# Patient Record
Sex: Female | Born: 2000 | Race: Black or African American | Hispanic: No | Marital: Single | Smoking: Never smoker
Health system: Southern US, Community
[De-identification: ages and names within clinical notes are randomized; demographics above are authoritative.]

## PROBLEM LIST (undated history)

## (undated) ENCOUNTER — Inpatient Hospital Stay (HOSPITAL_COMMUNITY): Payer: Self-pay

## (undated) ENCOUNTER — Ambulatory Visit (HOSPITAL_COMMUNITY): Payer: No Typology Code available for payment source

## (undated) DIAGNOSIS — F419 Anxiety disorder, unspecified: Secondary | ICD-10-CM

## (undated) DIAGNOSIS — N390 Urinary tract infection, site not specified: Secondary | ICD-10-CM

## (undated) DIAGNOSIS — D649 Anemia, unspecified: Secondary | ICD-10-CM

## (undated) HISTORY — PX: NO PAST SURGERIES: SHX2092

---
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
## 2022-04-04 ENCOUNTER — Emergency Department (HOSPITAL_COMMUNITY)
Admission: EM | Admit: 2022-04-04 | Discharge: 2022-04-04 | Disposition: A | Discharge status: Home / Self Care | Payer: Medicaid Other | Attending: Emergency Medicine | Admitting: Emergency Medicine

## 2022-04-04 ENCOUNTER — Encounter (HOSPITAL_COMMUNITY): Payer: Self-pay | Admitting: Emergency Medicine

## 2022-04-04 ENCOUNTER — Other Ambulatory Visit: Payer: Self-pay

## 2022-04-04 ENCOUNTER — Emergency Department (HOSPITAL_COMMUNITY): Payer: Medicaid Other

## 2022-04-04 DIAGNOSIS — R1030 Lower abdominal pain, unspecified: Secondary | ICD-10-CM

## 2022-04-04 DIAGNOSIS — R1031 Right lower quadrant pain: Secondary | ICD-10-CM | POA: Insufficient documentation

## 2022-04-04 DIAGNOSIS — R000 Tachycardia, unspecified: Secondary | ICD-10-CM | POA: Diagnosis not present

## 2022-04-04 LAB — CBC
HCT: 32.2 % — ABNORMAL LOW (ref 36.0–46.0)
Hemoglobin: 9.6 g/dL — ABNORMAL LOW (ref 12.0–15.0)
MCH: 20 pg — ABNORMAL LOW (ref 26.0–34.0)
MCHC: 29.8 g/dL — ABNORMAL LOW (ref 30.0–36.0)
MCV: 67.1 fL — ABNORMAL LOW (ref 80.0–100.0)
Platelets: 289 10*3/uL (ref 150–400)
RBC: 4.8 MIL/uL (ref 3.87–5.11)
RDW: 17.4 % — ABNORMAL HIGH (ref 11.5–15.5)
WBC: 6.8 10*3/uL (ref 4.0–10.5)
nRBC: 0 % (ref 0.0–0.2)

## 2022-04-04 LAB — URINALYSIS, ROUTINE W REFLEX MICROSCOPIC
Bacteria, UA: NONE SEEN
Bilirubin Urine: NEGATIVE
Glucose, UA: NEGATIVE mg/dL
Hgb urine dipstick: NEGATIVE
Ketones, ur: NEGATIVE mg/dL
Nitrite: NEGATIVE
Protein, ur: NEGATIVE mg/dL
Specific Gravity, Urine: 1.013 (ref 1.005–1.030)
pH: 8 (ref 5.0–8.0)

## 2022-04-04 LAB — COMPREHENSIVE METABOLIC PANEL
ALT: 9 U/L (ref 0–44)
AST: 18 U/L (ref 15–41)
Albumin: 4.1 g/dL (ref 3.5–5.0)
Alkaline Phosphatase: 66 U/L (ref 38–126)
Anion gap: 6 (ref 5–15)
BUN: 8 mg/dL (ref 6–20)
CO2: 23 mmol/L (ref 22–32)
Calcium: 10 mg/dL (ref 8.9–10.3)
Chloride: 107 mmol/L (ref 98–111)
Creatinine, Ser: 0.67 mg/dL (ref 0.44–1.00)
GFR, Estimated: >60 mL/min (ref >60)
Glucose, Bld: 91 mg/dL (ref 70–99)
Potassium: 4.4 mmol/L (ref 3.5–5.1)
Sodium: 136 mmol/L (ref 135–145)
Total Bilirubin: 0.4 mg/dL (ref 0.3–1.2)
Total Protein: 7.6 g/dL (ref 6.5–8.1)

## 2022-04-04 LAB — I-STAT BETA HCG BLOOD, ED (MC, WL, AP ONLY): I-stat hCG, quantitative: <5 m[IU]/mL (ref <5)

## 2022-04-04 LAB — LIPASE, BLOOD: Lipase: 33 U/L (ref 11–51)

## 2022-04-04 MED ORDER — ACETAMINOPHEN 500 MG PO TABS
1000.0000 mg | ORAL_TABLET | Freq: Once | ORAL | Status: AC
  Administered 2022-04-04: 1000 mg via ORAL
  Filled 2022-04-04: qty 2

## 2022-04-04 MED ORDER — ONDANSETRON 4 MG PO TBDP
8.0000 mg | ORAL_TABLET | Freq: Once | ORAL | Status: AC
  Administered 2022-04-04: 8 mg via ORAL
  Filled 2022-04-04: qty 2

## 2022-04-04 NOTE — ED Provider Notes (Signed)
[facility] EMERGENCY DEPARTMENT Provider Note   CSN: [id number] Arrival date & time: 04/04/22  1253     History  Chief Complaint  Patient presents with   Nausea   Abdominal Cramping    Amy Bond is a 21 y.o. female.   Abdominal Cramping     Patient with no documented comorbidities presents today due to right lower abdominal pain.  Started this morning after sexual intercourse with her partner.  States she has been trying to get pregnant, is concerned she could be pregnant today.  She endorses some swelling in her breast, the right lower quadrant abdominal pain is intermittent, it started off feeling very severe with a cramping sensation.  It went away, came back again.  Denies any periumbilical pain, vomiting, vaginal discharge, vaginal bleeding, previous abdominal surgeries, fevers.  Has had no medicine prior to arrival.  Home Medications Prior to Admission medications   Medication Sig Start Date End Date Taking? Authorizing Provider  ibuprofen (ADVIL,MOTRIN) 100 MG/5ML suspension Take 17 mLs (340 mg total) by mouth once. 07/20/13   Junius Creamer, NP      Allergies    Patient has no known allergies.    Review of Systems   Review of Systems  Physical Exam Updated Vital Signs BP 133/88 (BP Location: Right Arm)   Pulse (!) 114   Temp 98.3 F (36.8 C) (Oral)   Resp 18   SpO2 100%  Physical Exam Vitals and nursing note reviewed. Exam conducted with a chaperone present.  Constitutional:      Appearance: Normal appearance.     Comments: Comfortable appearing  HENT:     Head: Normocephalic and atraumatic.  Eyes:     General: No scleral icterus.       Right eye: No discharge.        Left eye: No discharge.     Extraocular Movements: Extraocular movements intact.     Pupils: Pupils are equal, round, and reactive to light.  Cardiovascular:     Rate and Rhythm: Regular rhythm. Tachycardia present.     Pulses: Normal pulses.     Heart sounds:  Normal heart sounds. No murmur heard.    No friction rub. No gallop.  Pulmonary:     Effort: Pulmonary effort is normal. No respiratory distress.     Breath sounds: Normal breath sounds.  Abdominal:     General: Abdomen is flat. Bowel sounds are normal. There is no distension.     Palpations: Abdomen is soft.     Tenderness: There is abdominal tenderness in the right lower quadrant. There is no right CVA tenderness, left CVA tenderness, guarding or rebound.    Skin:    General: Skin is warm and dry.     Coloration: Skin is not jaundiced.  Neurological:     Mental Status: She is alert. Mental status is at baseline.     Coordination: Coordination normal.     ED Results / Procedures / Treatments   Labs (all labs ordered are listed, but only abnormal results are displayed) Labs Reviewed  CBC - Abnormal; Notable for the following components:      Result Value   Hemoglobin 9.6 (*)    HCT 32.2 (*)    MCV 67.1 (*)    MCH 20.0 (*)    MCHC 29.8 (*)    RDW 17.4 (*)    All other components within normal limits  URINALYSIS, ROUTINE W REFLEX MICROSCOPIC - Abnormal; Notable for the  following components:   Color, Urine STRAW (*)    Leukocytes,Ua TRACE (*)    All other components within normal limits  LIPASE, BLOOD  COMPREHENSIVE METABOLIC PANEL  I-STAT BETA HCG BLOOD, ED ([facility], [facility], [facility] ONLY)    EKG None  Radiology No results found.  Procedures Procedures    Medications Ordered in ED Medications - No data to display  ED Course/ Medical Decision Making/ A&P                           Medical Decision Making Amount and/or Complexity of Data Reviewed Labs: ordered. Radiology: ordered.  Risk OTC drugs. Prescription drug management.   Patient presents due to right lower quadrant abdominal pain.  Differential includes but not limited to appendicitis, ovarian torsion, ovarian cyst, UTI, pyelonephritis, ruptured ectopic pregnancy, PID.  On exam patient has right lower  quadrant tenderness, there is no rebound tenderness or guarding.  No CVA tenderness, nontoxic-appearing although mildly tachycardic she is afebrile.  No SIRS criteria met. -BP 133/88 (BP Location: Right Arm)   Pulse (!) 114   Temp 98.3 F (36.8 C) (Oral)   Resp 18   SpO2 100%   I ordered, viewed and interpreted laboratory work-up. -CBC without leukocytosis.  Stable anemia with a hemoglobin of 9.6. -CMP without gross electrolyte derangement, AKI or transaminitis. -Patient is not pregnant, not an ectopic . -UA with very trace amount of leukocytes, no signs of UTI or pyelonephritis.    I ordered, viewed and interpreted ultrasound.  There is moderate mount of fluid which could be supportive of recent ovarian cyst rupture.  I think that is most likely given symptoms came on suddenly and then resolved and have not returned.    I considered getting CT of the abdomen to rule out appendicitis.  I think is somewhat less likely given no rebound, no leukocytosis or fever.  Patient states she has dinner plans and cannot stay later, we discussed strict return precautions         Final Clinical Impression(s) / ED Diagnoses Final diagnoses:  None    Rx / DC Orders ED Discharge Orders     None         Sherrill Raring, PA-C 04/04/22 2326    Blanchie Dessert, MD 04/05/22 1234

## 2022-04-04 NOTE — ED Notes (Signed)
Discharge instructions reviewed with patient. Patient verbalized understanding of instructions. Follow-up care and medications were reviewed. Patient ambulatory with steady gait. VSS upon discharge.  ?

## 2022-04-04 NOTE — Discharge Instructions (Addendum)
You are seen today in the emergency department for pain in the lower abdomen.  As we discussed I think the symptoms are likely due to urine cyst rupture.  The ultrasound supports that, we did not proceed with CT scan to rule for appendicitis given her pain is improved.  If the pain returns he should return back to the ED for CT scan.  Additionally if you have new concerning symptoms return to ED otherwise follow-up with your primary OB.  TRANSABDOMINAL AND TRANSVAGINAL ULTRASOUND OF PELVIS    DOPPLER ULTRASOUND OF OVARIES    TECHNIQUE:  Both transabdominal and transvaginal ultrasound examinations of the  pelvis were performed. Transabdominal technique was performed for  global imaging of the pelvis including uterus, ovaries, adnexal  regions, and pelvic cul-de-sac.    It was necessary to proceed with endovaginal exam following the  transabdominal exam to visualize the endometrium and ovaries. Color  and duplex Doppler ultrasound was utilized to evaluate blood flow to  the ovaries.    COMPARISON:  None Available.    FINDINGS:  Uterus    Measurements: 6.6 x 3.6 x 4.5 cm = volume: 56.3 19 mL. No fibroids  or other mass visualized.    Endometrium    Thickness: 7.8 mm.  No focal abnormality visualized.    Right ovary    Measurements: 3 x 3.2 x 3.4 cm = volume: 17.5 mL. Normal  appearance/no adnexal mass.    Left ovary    Measurements: 2.5 x 3 x 3 cm = volume: 12.1 mL. Normal appearance/no  adnexal mass.    Pulsed Doppler evaluation of both ovaries demonstrates normal  low-resistance arterial and venous waveforms.    Other findings    There is small to moderate amount of free fluid in pelvis.    IMPRESSION:  No significant sonographic abnormalities are seen in pelvis. There  is no evidence of ovarian torsion.    Small to moderate amount of free fluid in pelvis may suggest recent  rupture of ovarian cyst or follicle.

## 2022-04-04 NOTE — ED Triage Notes (Signed)
Patient endorses for the the last few weeks patient states she's been nauseated, exhausted, complaining of RLQ abdominal cramping, and urinary frequency. Patient unsure if she could be pregnant or not. Denies fevers/ chills. Aox4.

## 2022-04-17 ENCOUNTER — Encounter (HOSPITAL_COMMUNITY): Payer: Self-pay | Admitting: Emergency Medicine

## 2022-04-17 ENCOUNTER — Emergency Department (HOSPITAL_COMMUNITY)
Admission: EM | Admit: 2022-04-17 | Discharge: 2022-04-18 | Discharge status: Against Medical Advice | Payer: Medicaid Other | Attending: Emergency Medicine | Admitting: Emergency Medicine

## 2022-04-17 ENCOUNTER — Other Ambulatory Visit: Payer: Self-pay

## 2022-04-17 DIAGNOSIS — Z5321 Procedure and treatment not carried out due to patient leaving prior to being seen by health care provider: Secondary | ICD-10-CM | POA: Insufficient documentation

## 2022-04-17 DIAGNOSIS — R21 Rash and other nonspecific skin eruption: Secondary | ICD-10-CM | POA: Insufficient documentation

## 2022-04-17 NOTE — ED Provider Triage Note (Signed)
  Emergency Medicine Provider Triage Evaluation Note  MRN:  [id number]  Arrival date & time: 04/18/22    Medically screening exam initiated at 1:10 AM.   CC:   Rash  HPI:  Amy Bond is a 21 y.o. year-old female presents to the ED with chief complaint of rash on extremities.  Recent new pet.  States that it itches.  Denies seeing any insects.  History provided by patient. ROS:  -As included in HPI PE:  There were no vitals filed for this visit. Non-toxic appearing No respiratory distress A few macular lesions MDM:  Based on signs and symptoms, could be ringworm or scabies or flea bites.   Patient was informed that the remainder of the evaluation will be completed by another provider, this initial triage assessment does not replace that evaluation, and the importance of remaining in the ED until their evaluation is complete.    Montine Circle, PA-C 04/18/22 0110

## 2022-04-17 NOTE — ED Triage Notes (Signed)
Pt here with rash to her chest, c/o itching.

## 2022-04-18 NOTE — ED Notes (Signed)
Pt called for vital recheck x4 with no response. Pt removed from the floor.

## 2022-04-29 ENCOUNTER — Encounter (HOSPITAL_COMMUNITY): Payer: Self-pay

## 2022-04-29 ENCOUNTER — Emergency Department (HOSPITAL_COMMUNITY)
Admission: EM | Admit: 2022-04-29 | Discharge: 2022-04-29 | Discharge status: Against Medical Advice | Payer: Medicaid Other | Attending: Emergency Medicine | Admitting: Emergency Medicine

## 2022-04-29 ENCOUNTER — Other Ambulatory Visit: Payer: Self-pay

## 2022-04-29 DIAGNOSIS — Y92410 Unspecified street and highway as the place of occurrence of the external cause: Secondary | ICD-10-CM | POA: Diagnosis not present

## 2022-04-29 DIAGNOSIS — M545 Low back pain, unspecified: Secondary | ICD-10-CM | POA: Insufficient documentation

## 2022-04-29 DIAGNOSIS — M549 Dorsalgia, unspecified: Secondary | ICD-10-CM | POA: Diagnosis present

## 2022-04-29 LAB — BASIC METABOLIC PANEL
Anion gap: 9 (ref 5–15)
BUN: 9 mg/dL (ref 6–20)
CO2: 24 mmol/L (ref 22–32)
Calcium: 9.9 mg/dL (ref 8.9–10.3)
Chloride: 108 mmol/L (ref 98–111)
Creatinine, Ser: 0.79 mg/dL (ref 0.44–1.00)
GFR, Estimated: >60 mL/min (ref >60)
Glucose, Bld: 105 mg/dL — ABNORMAL HIGH (ref 70–99)
Potassium: 4.2 mmol/L (ref 3.5–5.1)
Sodium: 141 mmol/L (ref 135–145)

## 2022-04-29 LAB — I-STAT BETA HCG BLOOD, ED (MC, WL, AP ONLY): I-stat hCG, quantitative: 53.3 m[IU]/mL — ABNORMAL HIGH (ref <5)

## 2022-04-29 LAB — CBC
HCT: 31.2 % — ABNORMAL LOW (ref 36.0–46.0)
Hemoglobin: 9.2 g/dL — ABNORMAL LOW (ref 12.0–15.0)
MCH: 19.9 pg — ABNORMAL LOW (ref 26.0–34.0)
MCHC: 29.5 g/dL — ABNORMAL LOW (ref 30.0–36.0)
MCV: 67.4 fL — ABNORMAL LOW (ref 80.0–100.0)
Platelets: 326 10*3/uL (ref 150–400)
RBC: 4.63 MIL/uL (ref 3.87–5.11)
RDW: 17.9 % — ABNORMAL HIGH (ref 11.5–15.5)
WBC: 4.8 10*3/uL (ref 4.0–10.5)
nRBC: 0 % (ref 0.0–0.2)
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
## 2022-04-29 NOTE — ED Notes (Signed)
Patient refusing VS. Walked out stating "I'm losing my mind. I cant be here any more." Refusing to signed [facility] paperwork.
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
## 2022-04-30 ENCOUNTER — Other Ambulatory Visit: Payer: Self-pay

## 2022-04-30 ENCOUNTER — Encounter (HOSPITAL_COMMUNITY): Payer: Self-pay | Admitting: *Deleted

## 2022-04-30 ENCOUNTER — Inpatient Hospital Stay (HOSPITAL_COMMUNITY)
Admission: AD | Admit: 2022-04-30 | Discharge: 2022-04-30 | Disposition: A | Discharge status: Home / Self Care | Payer: Medicaid Other | Attending: Obstetrics and Gynecology | Admitting: Obstetrics and Gynecology

## 2022-04-30 DIAGNOSIS — Z3201 Encounter for pregnancy test, result positive: Secondary | ICD-10-CM | POA: Insufficient documentation

## 2022-04-30 DIAGNOSIS — Z3A01 Less than 8 weeks gestation of pregnancy: Secondary | ICD-10-CM | POA: Diagnosis not present

## 2022-04-30 DIAGNOSIS — O161 Unspecified maternal hypertension, first trimester: Secondary | ICD-10-CM

## 2022-04-30 DIAGNOSIS — R030 Elevated blood-pressure reading, without diagnosis of hypertension: Secondary | ICD-10-CM | POA: Insufficient documentation

## 2022-04-30 DIAGNOSIS — O26891 Other specified pregnancy related conditions, first trimester: Secondary | ICD-10-CM | POA: Insufficient documentation

## 2022-04-30 NOTE — Discharge Instructions (Signed)
[facility] Ob/Gyn Providers    [facility] at [facility]    Phone: [phone]  [facility] at [facility]   Phone: [phone]  [facility] at [place]  Phone: [phone]  [facility] at [place]  Phone: [phone]  [facility] at [place]  Phone: [phone]  [facility] at [facility]   Phone: [phone]  [facility] at [facility]                     Phone: [phone]                    Safe Medications in Pregnancy    Acne: Benzoyl Peroxide Salicylic Acid  Backache/Headache: Tylenol: 2 regular strength every 4 hours OR              2 Extra strength every 6 hours  Colds/Coughs/Allergies: Benadryl (alcohol free) 25 mg every 6 hours as needed Breath right strips Claritin Cepacol throat lozenges Chloraseptic throat spray Cold-Eeze- up to three times per day Cough drops, alcohol free Flonase (by prescription only) Guaifenesin Mucinex Robitussin DM (plain only, alcohol free) Saline nasal spray/drops Sudafed (pseudoephedrine) & Actifed ** use only after [redacted] weeks gestation and if you do not have high blood pressure Tylenol Vicks Vaporub Zinc lozenges Zyrtec   Constipation: Colace Ducolax suppositories Fleet enema Glycerin suppositories Metamucil Milk of magnesia Miralax Senokot Smooth move tea  Diarrhea: Kaopectate Imodium A-D  *NO pepto Bismol  Hemorrhoids: Anusol Anusol HC Preparation H Tucks  Indigestion: Tums Maalox Mylanta Zantac  Pepcid  Insomnia: Benadryl (alcohol free) 25mg every 6 hours as needed Tylenol PM Unisom, no Gelcaps  Leg Cramps: Tums MagGel  Nausea/Vomiting:  Bonine Dramamine Emetrol Ginger extract Sea bands Meclizine  Nausea medication to take during pregnancy:  Unisom (doxylamine succinate 25 mg tablets) Take one tablet daily at bedtime.  If symptoms are not adequately controlled, the dose can be increased to a maximum recommended dose of two tablets daily (1/2 tablet in the morning, 1/2 tablet mid-afternoon and one at bedtime). Vitamin B6 100mg tablets. Take one tablet twice a day (up to 200 mg per day).  Skin Rashes: Aveeno products Benadryl cream or 25mg every 6 hours as needed Calamine Lotion 1% cortisone cream  Yeast infection: Gyne-lotrimin 7 Monistat 7   **If taking multiple medications, please check labels to avoid duplicating the same active ingredients **take medication as directed on the label ** Do not exceed 4000 mg of tylenol in 24 hours **Do not take medications that contain aspirin or ibuprofen    

## 2022-04-30 NOTE — MAU Provider Note (Signed)
S [name] is a 21 y.o. G1P0 at unknown gestation who presents to MAU today for checkup. She found out she was pregnant yesterday in the ED and wasn't sure what to do next. Denies VB or abd pain.   ROS: No pain No VB  O BP (!) 145/70 (BP Location: Right Arm)   Pulse (!) 103   Temp 98.1 F (36.7 C) (Oral)   Resp 18   Ht 5\' 5"  (1.651 m)   Wt 62.5 kg   LMP 04/16/2022 (Approximate)   SpO2 100%   BMI 22.91 kg/m  Physical Exam Vitals and nursing note reviewed.  Constitutional:      General: She is not in acute distress.    Appearance: Normal appearance.  HENT:     Head: Normocephalic and atraumatic.  Cardiovascular:     Rate and Rhythm: Normal rate.  Pulmonary:     Effort: Pulmonary effort is normal. No respiratory distress.  Musculoskeletal:        General: Normal range of motion.  Neurological:     General: No focal deficit present.     Mental Status: She is alert and oriented to person, place, and time.  Psychiatric:        Mood and Affect: Mood normal.        Behavior: Behavior normal.    Results for orders placed or performed during the hospital encounter of 04/29/22 (from the past 48 hour(s))  CBC     Status: Abnormal   Collection Time: 04/29/22  7:55 PM  Result Value Ref Range   WBC 4.8 4.0 - 10.5 K/uL   RBC 4.63 3.87 - 5.11 MIL/uL   Hemoglobin 9.2 (L) 12.0 - 15.0 g/dL   HCT 13/01/23 (L) 13.2 - 44.0 %   MCV 67.4 (L) 80.0 - 100.0 fL   MCH 19.9 (L) 26.0 - 34.0 pg   MCHC 29.5 (L) 30.0 - 36.0 g/dL   RDW 10.2 (H) 72.5 - 36.6 %   Platelets 326 150 - 400 K/uL    Comment: REPEATED TO VERIFY   nRBC 0.0 0.0 - 0.2 %    Comment: Performed at [facility], [address], [place], [place]  Basic metabolic panel     Status: Abnormal   Collection Time: 04/29/22  7:55 PM  Result Value Ref Range   Sodium 141 135 - 145 mmol/L   Potassium 4.2 3.5 - 5.1 mmol/L   Chloride 108 98 - 111 mmol/L   CO2 24 22 - 32 mmol/L   Glucose, Bld 105 (H) 70 - 99 mg/dL     Comment: Glucose reference range applies only to samples taken after fasting for at least 8 hours.   BUN 9 6 - 20 mg/dL   Creatinine, Ser 13/01/23 0.44 - 1.00 mg/dL   Calcium 9.9 8.9 - 5.95 mg/dL   GFR, Estimated 63.8 >75 mL/min    Comment: (NOTE) Calculated using the CKD-EPI Creatinine Equation (2021)    Anion gap 9 5 - 15    Comment: Performed at [facility], [address], [place], [place]  I-Stat Beta hCG blood, ED ([facility], [facility], [facility] only)     Status: Abnormal   Collection Time: 04/29/22  8:27 PM  Result Value Ref Range   I-stat hCG, quantitative 53.3 (H) <5 mIU/mL   Comment 3            Comment:   GEST. AGE      CONC.  (  mIU/mL)   <=1 WEEK        5 - 50     2 WEEKS       50 - 500     3 WEEKS       100 - 10,000     4 WEEKS     1,000 - 30,000        FEMALE AND NON-PREGNANT FEMALE:     LESS THAN 5 mIU/mL     MDM: No complaints warranting evaluation today. Pt provided with list of OB providers, safe meds sheet, and instructed to start prenatal vitamins. BP elevated, denies hx of CHTN, will monitor. Stable for discharge.  A 1. Positive pregnancy test   2. Elevated blood pressure affecting pregnancy in first trimester, antepartum    P Discharge from MAU in stable condition Warning signs for worsening condition that would warrant emergency follow-up discussed Patient may return to MAU as needed for pregnancy related complaints   Allergies as of 04/30/2022   No Known Allergies      Medication List     STOP taking these medications    ibuprofen 100 MG/5ML suspension Commonly known as: ADVIL        Amy Bond, CNM 04/30/2022 1:25 PM

## 2022-04-30 NOTE — MAU Note (Signed)
Amy Bond is a 21 y.o. at Unknown here in MAU reporting: she found out she was pregnant yesterday and is here for a check up.  Denies VB or pain LMP: 04/16/22 Onset of complaint: N/A Pain score: 0 Vitals:   04/30/22 1304  BP: (!) 145/70  Pulse: (!) 103  Resp: 18  Temp: 98.1 F (36.7 C)  SpO2: 100%     FHT:N/A Lab orders placed from triage:   None

## 2022-05-05 ENCOUNTER — Encounter (HOSPITAL_COMMUNITY): Payer: Self-pay | Admitting: Emergency Medicine

## 2022-05-05 ENCOUNTER — Emergency Department (HOSPITAL_COMMUNITY)
Admission: EM | Admit: 2022-05-05 | Discharge: 2022-05-06 | Discharge status: Against Medical Advice | Payer: Medicaid Other | Attending: Emergency Medicine | Admitting: Emergency Medicine

## 2022-05-05 ENCOUNTER — Other Ambulatory Visit: Payer: Self-pay

## 2022-05-05 DIAGNOSIS — Z5321 Procedure and treatment not carried out due to patient leaving prior to being seen by health care provider: Secondary | ICD-10-CM | POA: Diagnosis not present

## 2022-05-05 DIAGNOSIS — M545 Low back pain, unspecified: Secondary | ICD-10-CM | POA: Diagnosis not present
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
## 2022-05-06 LAB — PREGNANCY, URINE: Preg Test, Ur: NEGATIVE

## 2022-05-06 LAB — URINALYSIS, ROUTINE W REFLEX MICROSCOPIC
Bilirubin Urine: NEGATIVE
Glucose, UA: NEGATIVE mg/dL
Hgb urine dipstick: NEGATIVE
Ketones, ur: 20 mg/dL — AB
Nitrite: NEGATIVE
Protein, ur: 100 mg/dL — AB
Specific Gravity, Urine: 1.029 (ref 1.005–1.030)
pH: 5 (ref 5.0–8.0)

## 2022-05-06 LAB — I-STAT BETA HCG BLOOD, ED (MC, WL, AP ONLY): I-stat hCG, quantitative: 34.6 m[IU]/mL — ABNORMAL HIGH (ref <5)

## 2022-05-06 NOTE — ED Notes (Signed)
Pt called to be seen in triage, no answer

## 2022-05-07 ENCOUNTER — Inpatient Hospital Stay (HOSPITAL_COMMUNITY)
Admission: AD | Admit: 2022-05-07 | Discharge: 2022-05-07 | Disposition: A | Discharge status: Home / Self Care | Payer: Medicaid Other | Attending: Family Medicine | Admitting: Family Medicine

## 2022-05-07 DIAGNOSIS — O039 Complete or unspecified spontaneous abortion without complication: Secondary | ICD-10-CM | POA: Diagnosis not present

## 2022-05-07 DIAGNOSIS — Z3A01 Less than 8 weeks gestation of pregnancy: Secondary | ICD-10-CM

## 2022-05-07 LAB — HCG, QUANTITATIVE, PREGNANCY: hCG, Beta Chain, Quant, S: <1 m[IU]/mL (ref <5)

## 2022-05-07 NOTE — MAU Provider Note (Addendum)
History   Chief Complaint:  concerned about pregnancy   Amy Bond is  21 y.o. G1P0 Patient's last menstrual period was 04/16/2022 (approximate).. Patient is here for follow up of quantitative HCG and ongoing surveillance of pregnancy status. She is [redacted]w[redacted]d weeks gestation  by LMP.     Since her last visit, the patient is without new complaint. The patient reports bleeding as  none now.  She denies any pain.   General ROS:  negative   Her previous Quantitative HCG values are:   Latest Reference Range & Units 04/29/22 20:27 05/06/22 00:24  I-stat hCG, quantitative <5 mIU/mL 53.3 (H) 34.6 (H)  (H): Data is abnormally high   Physical Exam   Blood pressure 129/80, pulse 74, temperature 98.2 F (36.8 C), resp. rate 16, height 5\' 5"  (1.651 m), weight 62.1 kg, last menstrual period 04/16/2022, SpO2 99 %.   Physical Exam Vitals and nursing note reviewed.  Constitutional:      General: She is not in acute distress.    Appearance: She is well-developed.  HENT:     Head: Normocephalic.  Eyes:     Pupils: Pupils are equal, round, and reactive to light.  Cardiovascular:     Rate and Rhythm: Normal rate and regular rhythm.  Pulmonary:     Effort: Pulmonary effort is normal. No respiratory distress.     Breath sounds: Normal breath sounds.  Abdominal:     Palpations: Abdomen is soft.     Tenderness: There is no abdominal tenderness.  Musculoskeletal:        General: Normal range of motion.     Cervical back: Normal range of motion.  Skin:    General: Skin is warm and dry.  Neurological:     Mental Status: She is alert and oriented to person, place, and time.  Psychiatric:        Behavior: Behavior normal.        Thought Content: Thought content normal.        Judgment: Judgment normal.     Labs: No results found for this or any previous visit (from the past 24 hour(s)).    HCG repeated.   Care turned over to [facility] Erryn Dickison CNM at 2200.   [facility],  CNM 05/07/22 Results for orders placed or performed during the hospital encounter of 05/07/22 (from the past 24 hour(s))  hCG, quantitative, pregnancy     Status: None   Collection Time: 05/07/22 10:31 PM  Result Value Ref Range   hCG, Beta Chain, Quant, S <1 <5 mIU/mL    & Units 2 d ago 9 d ago 1 mo ago   I-stat hCG, quantitative <5 mIU/mL 34.6 High  53.3 High  <5.0  Comment 3            Reviewed results with patient and her partner Patient tearful Has never had any bleeding.  LMP 04/16/22 (3 weeks ago) Discussed will arrange followup in office  Assessment:  Pregnancy at [redacted]w[redacted]d  Complete Spontaneous abortion  Plan: -Discharge home in stable condition -SAB precautions discussed -Patient advised to follow-up with Office (message sent) -Patient may return to MAU as needed or if her condition were to change or worsen

## 2022-05-07 NOTE — Progress Notes (Addendum)
Written and verbal d/c instructions given and understanding voiced. Pt crying due to BHCG being <5. Significant other with pt and supportive. Emotional support given.

## 2022-05-07 NOTE — MAU Note (Addendum)
.  Amy Bond is a 21 y.o. at [redacted]w[redacted]d here in MAU reporting coming in for repeat BHCG. Denies VB. Occ abdominal cramping. States she noticed in her Mychart that her BHCG numbers are decreasing and the last was 34 when she went to [facility] ED 2 days ago. Concerned about the pregnancy.  LMP: 10/19 Onset of complaint: n/a Pain score: 3 Vitals:   05/07/22 2124  BP: 129/80  Pulse: 74  Resp: 16  Temp: 98.2 F (36.8 C)  SpO2: 99%     FHT:n/a Lab orders placed from triage:  u/a

## 2022-05-08 DIAGNOSIS — O039 Complete or unspecified spontaneous abortion without complication: Secondary | ICD-10-CM | POA: Diagnosis present

## 2022-06-05 ENCOUNTER — Encounter: Payer: Self-pay | Admitting: Advanced Practice Midwife

## 2022-06-05 ENCOUNTER — Other Ambulatory Visit: Payer: Self-pay

## 2022-06-05 ENCOUNTER — Ambulatory Visit (INDEPENDENT_AMBULATORY_CARE_PROVIDER_SITE_OTHER): Payer: Medicaid Other | Admitting: Advanced Practice Midwife

## 2022-06-05 VITALS — BP 131/84 | HR 85 | Ht 65.0 in | Wt 138.9 lb

## 2022-06-05 DIAGNOSIS — Z3A00 Weeks of gestation of pregnancy not specified: Secondary | ICD-10-CM | POA: Diagnosis not present

## 2022-06-05 DIAGNOSIS — Z3169 Encounter for other general counseling and advice on procreation: Secondary | ICD-10-CM | POA: Diagnosis not present

## 2022-06-05 DIAGNOSIS — O039 Complete or unspecified spontaneous abortion without complication: Secondary | ICD-10-CM

## 2022-06-05 NOTE — Progress Notes (Signed)
Pt reports no interest in [facility] today

## 2022-06-05 NOTE — Progress Notes (Unsigned)
   Established Patient Office Visit  Subjective   Patient ID: Amy Bond, female    DOB: September 05, 2000  Age: 21 y.o. MRN: [id number]  Chief Complaint  Patient presents with   Follow-up    Pt is here for F/U after early SAB. Denies pain or bleeding. Desires pregnancy.   Patient Active Problem List   Diagnosis Date Noted   Complete spontaneous abortion 05/08/2022   History reviewed. No pertinent past medical history. No past surgical history on file. Social History   Tobacco Use   Smoking status: Never   Smokeless tobacco: Never  Substance Use Topics   Alcohol use: Never   Drug use: Never   No Known Allergies    Review of Systems  Constitutional:  Negative for chills and fever.  Gastrointestinal:  Negative for abdominal pain.  Genitourinary:        Neg for vaginal bleeding     Objective:     BP 131/84   Pulse 85   Ht 5\' 5"  (1.651 m)   Wt 138 lb 14.4 oz (63 kg)   LMP 04/16/2022 (Approximate) Comment: sab  Breastfeeding Unknown   BMI 23.11 kg/m  BP Readings from Last 3 Encounters:  06/05/22 131/84  05/07/22 129/80  05/05/22 129/85      Physical Exam Constitutional:      General: She is not in acute distress.    Appearance: She is normal weight. She is not ill-appearing.  Eyes:     Conjunctiva/sclera: Conjunctivae normal.  Cardiovascular:     Rate and Rhythm: Normal rate.  Pulmonary:     Effort: Pulmonary effort is normal.  Skin:    General: Skin is warm and dry.  Neurological:     Mental Status: She is alert and oriented to person, place, and time.  Psychiatric:        Mood and Affect: Mood normal.    Latest Reference Range & Units 04/29/22 20:27 05/06/22 00:24 05/06/22 00:49 05/07/22 22:31 06/05/22 09:13  hCG Quant mIU/mL     <1  Preg Test, Ur NEGATIVE    NEGATIVE    HCG, Beta Chain, Quant, S <5 mIU/mL    <1   I-stat hCG, quantitative <5 mIU/mL 53.3 (H) 34.6 (H)     (H): Data is abnormally high    Assessment & Plan:  1. SAB (spontaneous  abortion) - Beta hCG quant (ref lab) - ABO AND RH   2. Complete miscarriage - Support given. Pt and partner coping well. Offered IBH, declined  3. Pre-conception counseling - Pt already on prenatal vitamin. - Discussed importance of optimizing health prior to TTC  - F/UPRN   14/08/23, CNM

## 2022-06-06 LAB — BETA HCG QUANT (REF LAB): hCG Quant: <1 m[IU]/mL

## 2022-06-09 LAB — ABO AND RH: Rh Factor: NEGATIVE

## 2022-06-09 LAB — ANTIBODY SCREEN: Antibody Screen: NEGATIVE

## 2022-06-16 ENCOUNTER — Encounter: Payer: Self-pay | Admitting: Advanced Practice Midwife

## 2022-06-16 DIAGNOSIS — Z6791 Unspecified blood type, Rh negative: Secondary | ICD-10-CM | POA: Insufficient documentation

## 2022-06-16 DIAGNOSIS — O26899 Other specified pregnancy related conditions, unspecified trimester: Secondary | ICD-10-CM | POA: Insufficient documentation

## 2022-06-16 HISTORY — DX: Unspecified blood type, rh negative: Z67.91

## 2022-06-18 ENCOUNTER — Telehealth: Payer: Medicaid Other

## 2022-10-05 ENCOUNTER — Encounter: Payer: Self-pay | Admitting: Advanced Practice Midwife

## 2022-12-01 ENCOUNTER — Encounter: Payer: Self-pay | Admitting: Advanced Practice Midwife

## 2022-12-09 ENCOUNTER — Telehealth: Payer: Self-pay | Admitting: Certified Nurse Midwife

## 2022-12-09 ENCOUNTER — Inpatient Hospital Stay (HOSPITAL_COMMUNITY)
Admission: AD | Admit: 2022-12-09 | Discharge: 2022-12-09 | Disposition: A | Discharge status: Home / Self Care | Payer: Medicaid Other | Attending: Obstetrics and Gynecology | Admitting: Obstetrics and Gynecology

## 2022-12-09 ENCOUNTER — Encounter (HOSPITAL_COMMUNITY): Payer: Self-pay | Admitting: *Deleted

## 2022-12-09 DIAGNOSIS — Z711 Person with feared health complaint in whom no diagnosis is made: Secondary | ICD-10-CM | POA: Insufficient documentation

## 2022-12-09 DIAGNOSIS — O26891 Other specified pregnancy related conditions, first trimester: Secondary | ICD-10-CM | POA: Insufficient documentation

## 2022-12-09 DIAGNOSIS — Z3A01 Less than 8 weeks gestation of pregnancy: Secondary | ICD-10-CM

## 2022-12-09 DIAGNOSIS — R109 Unspecified abdominal pain: Secondary | ICD-10-CM | POA: Diagnosis not present

## 2022-12-09 HISTORY — DX: Anemia, unspecified: D64.9

## 2022-12-09 HISTORY — DX: Anxiety disorder, unspecified: F41.9

## 2022-12-09 HISTORY — DX: Urinary tract infection, site not specified: N39.0

## 2022-12-09 LAB — HCG, QUANTITATIVE, PREGNANCY: hCG, Beta Chain, Quant, S: 109 m[IU]/mL — ABNORMAL HIGH (ref <5)

## 2022-12-09 NOTE — Telephone Encounter (Signed)
I connected with  Amy Bond on 12/09/22 by telephone and verified that I am speaking with the correct person using two identifiers.   I reviewed Quant results of 109 with patient and reinforced that at this point it may still be too early to tell and that the recommendation is to repeat quant in 48 hours.   Patient agreeable to plan of care.   Patient is to return to MAU on 12/11/22.   Satoya Feeley Danella Deis) Suzie Portela, MSN, CNM  Center for [facility]  12/09/2022 6:11 PM

## 2022-12-09 NOTE — MAU Note (Signed)
Amy Bond is a 22 y.o. at Unknown here in MAU reporting: +HPT on Sat.  Went to [facility] (has AVS on phone)on Sunday and they confirmed it.  Hx of early miscarriage.  Came in mainly to make sure everything was ok, maybe get an [place].  Slight cramps 2 days ago. No bleeding. LMP: 5/11 Onset of complaint: 2 days ago, stopped yesterday Pain score: was a 2, none now Vitals:   12/09/22 1620  BP: 130/80  Pulse: 90  Resp: 17  Temp: 98.9 F (37.2 C)  SpO2: 100%      Lab orders placed from triage:

## 2022-12-09 NOTE — MAU Provider Note (Signed)
History     CSN: [id number]  Arrival date and time: 12/09/22 1559   Event Date/Time   First Provider Initiated Contact with Patient 12/09/22 1639      Chief Complaint  Patient presents with   Abdominal Pain   Amy Bond , a  22 y.o. G2P0010 at [redacted]w[redacted]d presents to MAU with complaints of abdominal pain that started 2 days ago. She states that pain felt like cramping and stopped yesterday. She states that pain was a 2/10 and currently rates pain a 0/10. She denies abnormal vaginal discharge, vaginal bleeding or urinary symptoms. Patient states she was seen at [facility] medical center on 12/06/22 and confirmed pregnancy via UPT and Quant. ( Quant results reviewed by CNM noted to be 15) She states that she had a miscarriage 6 months ago and wants to make sure that everything is okay. She strongly desires this pregnancy. Reports LMP was 11/07/22.          OB History     Gravida  2   Para      Term      Preterm      AB  1   Living         SAB  1   IAB      Ectopic      Multiple      Live Births              Past Medical History:  Diagnosis Date   Anemia    Anxiety    UTI (urinary tract infection)     Past Surgical History:  Procedure Laterality Date   NO PAST SURGERIES      Family History  Problem Relation Age of Onset   Healthy Mother    Other Mother        2021- killed   Healthy Father     Social History   Tobacco Use   Smoking status: Never   Smokeless tobacco: Never   Tobacco comments:    Quit vaping May 2024  Vaping Use   Vaping Use: Former  Substance Use Topics   Alcohol use: Never   Drug use: Never    Allergies: No Known Allergies  No medications prior to admission.    Review of Systems  Gastrointestinal:  Positive for abdominal pain.   Physical Exam   Blood pressure 130/80, pulse 90, temperature 98.9 F (37.2 C), temperature source Oral, resp. rate 17, height 5\' 5"  (1.651 m), weight 58.2 kg, last menstrual period  11/07/2022, SpO2 100 %, unknown if currently breastfeeding.  Physical Exam Vitals and nursing note reviewed.  Constitutional:      General: She is not in acute distress.    Appearance: Normal appearance.  HENT:     Head: Normocephalic.  Pulmonary:     Effort: Pulmonary effort is normal.  Musculoskeletal:     Cervical back: Normal range of motion.  Skin:    General: Skin is warm and dry.  Neurological:     Mental Status: She is alert and oriented to person, place, and time.  Psychiatric:        Mood and Affect: Mood normal.     MAU Course  Procedures Orders Placed This Encounter  Procedures   hCG, quantitative, pregnancy   Discharge patient   - Reviewed Beta Quant from [facility] medical center and was 15. Recollect today.   MDM - Pain stopped yesterday. Patient not having any at this time.  - Discussed that based on  LMP at 4 weeks we may not see anything on [place] and that will likely require a repeat US in 10-14 days.  - Patient has viability scan already scheduled for 01/04/23.  - Reviewed with Dr. Crissie Reese patient presenation and current clinical picture. Per MD recommendation given low quant at [facility] medical center, also repeat quant in 48 hours from today.  - Patient clinically stable at this time. Offered to repeat quant today and notify patient of results.  Patient agreeable to plan of care at this time.  - Plan for discharge.   Assessment and Plan   1. Abdominal cramping   2. [redacted] weeks gestation of pregnancy   3. Physically well but worried    - Reviewed plan of care with patients husband and he also agreeable to plan of care.  - CNM to call and notify patient of results once they are completed.  - Patient unable to repeat quant Outpatient on Friday given work schedule and offices close early. Patient scheduled for repeat Quant on Friday in MAU at 5pm.  - Worsening signs and ectopic precautions reviewed.  - Patient discharged home in stable condition and may return to  MAU as needed.   Claudette Head, MSN CNM  12/09/2022, 4:39 PM

## 2022-12-11 ENCOUNTER — Inpatient Hospital Stay (HOSPITAL_COMMUNITY)
Admission: AD | Admit: 2022-12-11 | Discharge: 2022-12-11 | Disposition: A | Discharge status: Home / Self Care | Payer: Medicaid Other | Attending: Obstetrics & Gynecology | Admitting: Obstetrics & Gynecology

## 2022-12-11 ENCOUNTER — Other Ambulatory Visit (HOSPITAL_COMMUNITY)
Admit: 2022-12-11 | Discharge: 2022-12-11 | Disposition: A | Discharge status: Home / Self Care | Payer: Medicaid Other | Attending: Obstetrics & Gynecology | Admitting: Obstetrics & Gynecology

## 2022-12-11 DIAGNOSIS — Z3A01 Less than 8 weeks gestation of pregnancy: Secondary | ICD-10-CM | POA: Diagnosis not present

## 2022-12-11 DIAGNOSIS — O3680X0 Pregnancy with inconclusive fetal viability, not applicable or unspecified: Secondary | ICD-10-CM | POA: Insufficient documentation

## 2022-12-11 LAB — HCG, QUANTITATIVE, PREGNANCY: hCG, Beta Chain, Quant, S: 190 m[IU]/mL — ABNORMAL HIGH (ref <5)

## 2022-12-11 NOTE — MAU Note (Signed)
Amy Bond is a 22 y.o. at [redacted]w[redacted]d here in MAU reporting: here for repeat blood work (HCG level).  Pt denies any pain or bleeding. Other than tired, is doing well.  No new issues.  Onset of complaint: follow up from Wed Pain score: none Vitals:   12/11/22 1611  BP: 130/70  Pulse: 98  Resp: 16  Temp: 98.5 F (36.9 C)  SpO2: 100%      Lab orders placed from triage:

## 2022-12-11 NOTE — MAU Provider Note (Signed)
Event Date/Time  First Provider Initiated Contact with Patient 12/11/22 1616     S [name] is a 22 y.o. G2P0010 patient who presents to MAU today for repeat quant hCG. She is without complaint. She denies abdominal pain, vaginal bleeding, dysuria, and fever.  O BP 130/70 (BP Location: Right Arm)   Pulse 98   Temp 98.5 F (36.9 C) (Oral)   Resp 16   Ht 5\' 5"  (1.651 m)   Wt 58.6 kg   LMP 11/07/2022   SpO2 100%   BMI 21.50 kg/m   Physical Exam Vitals and nursing note reviewed.  Constitutional:      Appearance: Normal appearance. She is not ill-appearing.  Cardiovascular:     Rate and Rhythm: Normal rate.  Pulmonary:     Effort: Pulmonary effort is normal.  Abdominal:     General: Abdomen is flat.  Neurological:     Mental Status: She is alert.  Psychiatric:        Mood and Affect: Mood normal.        Behavior: Behavior normal.        Thought Content: Thought content normal.        Judgment: Judgment normal.    A Medical screening exam complete Appropriate rise in quant hCG: --15 at [facility] on 12/07/2022 --109 in MAU on 12/09/2022 --Now 190, reassuring rise --Results discussed via MyChart message per patient request  P Discharge from MAU in stable condition with first trimester precautions  F/U: --Ectopic and miscarriage precautions reviewed by CNM prior to discharge and again via MyChart message --Patient scheduled for viability ultrasound in early July  Clayton Bibles, MSA, MSN, CNM Certified Nurse Midwife, Biochemist, clinical for [employer], [employer]

## 2022-12-17 ENCOUNTER — Encounter: Payer: Self-pay | Admitting: Obstetrics & Gynecology

## 2022-12-18 ENCOUNTER — Inpatient Hospital Stay (HOSPITAL_COMMUNITY)
Admission: AD | Admit: 2022-12-18 | Discharge: 2022-12-18 | Disposition: A | Discharge status: Home / Self Care | Payer: Medicaid Other | Attending: Family Medicine | Admitting: Family Medicine

## 2022-12-18 ENCOUNTER — Encounter (HOSPITAL_COMMUNITY): Payer: Self-pay | Admitting: Family Medicine

## 2022-12-18 ENCOUNTER — Other Ambulatory Visit: Payer: Self-pay

## 2022-12-18 ENCOUNTER — Inpatient Hospital Stay (HOSPITAL_COMMUNITY): Payer: Medicaid Other

## 2022-12-18 DIAGNOSIS — O200 Threatened abortion: Secondary | ICD-10-CM

## 2022-12-18 DIAGNOSIS — O209 Hemorrhage in early pregnancy, unspecified: Secondary | ICD-10-CM | POA: Diagnosis present

## 2022-12-18 DIAGNOSIS — Z3A01 Less than 8 weeks gestation of pregnancy: Secondary | ICD-10-CM | POA: Diagnosis not present

## 2022-12-18 DIAGNOSIS — O3680X0 Pregnancy with inconclusive fetal viability, not applicable or unspecified: Secondary | ICD-10-CM | POA: Diagnosis not present

## 2022-12-18 DIAGNOSIS — O99011 Anemia complicating pregnancy, first trimester: Secondary | ICD-10-CM | POA: Insufficient documentation

## 2022-12-18 DIAGNOSIS — O09293 Supervision of pregnancy with other poor reproductive or obstetric history, third trimester: Secondary | ICD-10-CM | POA: Insufficient documentation

## 2022-12-18 LAB — CBC
HCT: 30.1 % — ABNORMAL LOW (ref 36.0–46.0)
Hemoglobin: 9.2 g/dL — ABNORMAL LOW (ref 12.0–15.0)
MCH: 20.3 pg — ABNORMAL LOW (ref 26.0–34.0)
MCHC: 30.6 g/dL (ref 30.0–36.0)
MCV: 66.3 fL — ABNORMAL LOW (ref 80.0–100.0)
Platelets: 274 10*3/uL (ref 150–400)
RBC: 4.54 MIL/uL (ref 3.87–5.11)
RDW: 19 % — ABNORMAL HIGH (ref 11.5–15.5)
WBC: 6.1 10*3/uL (ref 4.0–10.5)
nRBC: 0 % (ref 0.0–0.2)

## 2022-12-18 LAB — HCG, QUANTITATIVE, PREGNANCY: hCG, Beta Chain, Quant, S: 5930 m[IU]/mL — ABNORMAL HIGH (ref <5)

## 2022-12-18 LAB — WET PREP, GENITAL
Clue Cells Wet Prep HPF POC: NONE SEEN
Sperm: NONE SEEN
Trich, Wet Prep: NONE SEEN
WBC, Wet Prep HPF POC: <10 (ref <10)
Yeast Wet Prep HPF POC: NONE SEEN

## 2022-12-18 LAB — ABO/RH: ABO/RH(D): O NEG

## 2022-12-18 NOTE — MAU Note (Signed)
Amy Bond is a 22 y.o. at [redacted]w[redacted]d here in MAU reporting: using the bathroom around 1500 and noticed vaginal bleeding when she wiped and 2 small spots in her underwear. States it was mainly vaginal discharge mixed with bright red and it is now brown in color. Denies any bleeding since then. Pt is not wearing a pad. States she has been cramping on and off in mid lower abdomen. Denies any recent sexual intercourse. Denies any vaginal pain or itching. Denies any other changes to vaginal discharge. Denies urinary symptoms. LMP: 11/07/22 Onset of complaint: 1500 Pain score: 0 Vitals:   12/18/22 1610  BP: 126/65  Pulse: 89  Resp: 17  Temp: 98.3 F (36.8 C)     FHT: n/a Lab orders placed from triage:

## 2022-12-18 NOTE — MAU Provider Note (Signed)
History     CSN: [id number]  Arrival date and time: 12/18/22 1557   None     Chief Complaint  Patient presents with   Vaginal Bleeding   Patient presenting for evaluation today because she noticed a small amount of blood on tissue paper this morning when she wiped.  Denies any abdominal pain, cramping, gushing of fluid, contractions.  Denies any other symptoms other than the small amount of bleeding.  Has had miscarriage in the past and is concerned about her pregnancy.    OB History     Gravida  2   Para      Term      Preterm      AB  1   Living         SAB  1   IAB      Ectopic      Multiple      Live Births              Past Medical History:  Diagnosis Date   Anemia    Anxiety    UTI (urinary tract infection)     Past Surgical History:  Procedure Laterality Date   NO PAST SURGERIES      Family History  Problem Relation Age of Onset   Healthy Mother    Other Mother        2021- killed   Healthy Father     Social History   Tobacco Use   Smoking status: Never   Smokeless tobacco: Never   Tobacco comments:    Quit vaping May 2024  Vaping Use   Vaping Use: Former  Substance Use Topics   Alcohol use: Never   Drug use: Never    Allergies: No Known Allergies  No medications prior to admission.    Review of Systems  Constitutional:  Negative for chills and fever.  Gastrointestinal:  Negative for abdominal pain.  Genitourinary:  Positive for vaginal bleeding and vaginal discharge. Negative for vaginal pain.   Physical Exam   Blood pressure 126/65, pulse 89, temperature 98.3 F (36.8 C), temperature source Oral, resp. rate 17, height 5\' 5"  (1.651 m), weight 59.6 kg, last menstrual period 11/07/2022, unknown if currently breastfeeding.  Physical Exam Vitals reviewed.  Constitutional:      Appearance: Normal appearance.  HENT:     Head: Normocephalic and atraumatic.     Mouth/Throat:     Mouth: Mucous membranes are moist.   Eyes:     Extraocular Movements: Extraocular movements intact.  Cardiovascular:     Rate and Rhythm: Normal rate.  Pulmonary:     Effort: Pulmonary effort is normal.  Abdominal:     General: Abdomen is flat. There is no distension.     Palpations: Abdomen is soft.     Tenderness: There is no abdominal tenderness.  Musculoskeletal:        General: Normal range of motion.  Skin:    General: Skin is warm.     Capillary Refill: Capillary refill takes less than 2 seconds.  Neurological:     General: No focal deficit present.     Mental Status: She is alert.  Psychiatric:        Mood and Affect: Mood normal.     MAU Course  Procedures  MDM CBC Ultrasound Beta-hCG quant  Assessment and Plan  Amy Bond is a 22 [name] @[redacted]w[redacted]d  presenting for vaginal bleeding   Vaginal bleeding/threatened abortion/pregnancy of unknown location Patient with  minimal amount of vaginal bleeding today.  Previously evaluated and had low beta-hCG quant.  Beta-hCG quant appropriate today at 5000 930.  CBC showing anemia with hemoglobin of 9.2.  ABO is O-.  Patient received RhoGAM less than 2 weeks ago.  Ultrasound showing gestational sac within the fundus of the uterus without yolk sac or fetal pole.  This could be early gestation versus failed gestation with incomplete abortion.  Patient scheduled for viability ultrasound on 01/05/2023.  Discussed strict return precautions and importance of following up with viability ultrasound.  Patient is agreeable.  Patient discharged home.   Celedonio Savage 12/18/2022, 6:26 PM

## 2022-12-18 NOTE — Discharge Instructions (Signed)
It was a pleasure taking care of you today.  Please repeated your hCG levels and they have not risen appropriately.  We got an ultrasound today and were able to see a sac but could not see baby in the sac.  It may be too early for this and so it is recommended that she get a repeat ultrasound for viability.  You are already scheduled to have your ultrasound in 2 weeks so you can go to this appointment.  If your bleeding worsens or you have any other concerns please return for further evaluation.  I hope you have a wonderful day!

## 2022-12-21 LAB — GC/CHLAMYDIA PROBE AMP (~~LOC~~) NOT AT ARMC
Chlamydia: NEGATIVE
Comment: NEGATIVE
Comment: NORMAL
Neisseria Gonorrhea: NEGATIVE

## 2023-01-05 ENCOUNTER — Other Ambulatory Visit (INDEPENDENT_AMBULATORY_CARE_PROVIDER_SITE_OTHER): Payer: Medicaid Other

## 2023-01-05 ENCOUNTER — Ambulatory Visit (INDEPENDENT_AMBULATORY_CARE_PROVIDER_SITE_OTHER): Payer: Medicaid Other | Admitting: *Deleted

## 2023-01-05 VITALS — BP 121/79 | HR 97 | Wt 129.0 lb

## 2023-01-05 DIAGNOSIS — O0990 Supervision of high risk pregnancy, unspecified, unspecified trimester: Secondary | ICD-10-CM | POA: Insufficient documentation

## 2023-01-05 DIAGNOSIS — Z3481 Encounter for supervision of other normal pregnancy, first trimester: Secondary | ICD-10-CM

## 2023-01-05 DIAGNOSIS — Z3A01 Less than 8 weeks gestation of pregnancy: Secondary | ICD-10-CM

## 2023-01-05 DIAGNOSIS — Z3480 Encounter for supervision of other normal pregnancy, unspecified trimester: Secondary | ICD-10-CM

## 2023-01-05 DIAGNOSIS — Z3400 Encounter for supervision of normal first pregnancy, unspecified trimester: Secondary | ICD-10-CM

## 2023-01-05 DIAGNOSIS — O3680X0 Pregnancy with inconclusive fetal viability, not applicable or unspecified: Secondary | ICD-10-CM

## 2023-01-05 NOTE — Progress Notes (Signed)
New OB Intake  I explained I am completing New OB Intake today. We discussed EDD of 08/23/2023, by Ultrasound. Pt is G2P0010. I reviewed her allergies, medications and Medical/Surgical/OB history.    Patient Active Problem List   Diagnosis Date Noted   Supervision of other normal pregnancy, antepartum 01/05/2023   Rh negative status during pregnancy 06/16/2022    Concerns addressed today  Patient informed that the ultrasound is considered a limited obstetric ultrasound and is not intended to be a complete ultrasound exam.  Patient also informed that the ultrasound is not being completed with the intent of assessing for fetal or placental anomalies or any pelvic abnormalities. Explained that the purpose of today's ultrasound is to assess for viability.  Patient acknowledges the purpose of the exam and the limitations of the study.     Delivery Plans Plans to deliver at [facility]. Discussed the nature of our practice with multiple providers including residents and students. Due to the size of the practice, the delivering provider may not be the same as those providing prenatal care.   MyChart/Babyscripts MyChart access verified. I explained pt will have some visits in office and some virtually. Babyscripts app discussed and ordered.   Blood Pressure Cuff Blood pressure cuff discussed and needs to be given . Discussed to be used for virtual visits and or if needed BP checks weekly.  Anatomy US Explained first scheduled Korea will be around 19 weeks.   Last Pap NA  First visit review I reviewed new OB appt with patient. Explained pt will be seen by Dr Macon Large at first visit. Discussed Avelina Laine genetic screening with patient to be drawn at [facility]. Routine prenatal labs to be drawn at [facility].    Scheryl Marten, RN 01/05/2023  9:41 AM

## 2023-01-26 ENCOUNTER — Encounter: Payer: Self-pay | Admitting: Obstetrics & Gynecology

## 2023-01-26 ENCOUNTER — Ambulatory Visit (INDEPENDENT_AMBULATORY_CARE_PROVIDER_SITE_OTHER): Payer: Medicaid Other | Admitting: Obstetrics & Gynecology

## 2023-01-26 VITALS — BP 128/79 | HR 78 | Wt 128.0 lb

## 2023-01-26 DIAGNOSIS — O219 Vomiting of pregnancy, unspecified: Secondary | ICD-10-CM

## 2023-01-26 DIAGNOSIS — Z3A10 10 weeks gestation of pregnancy: Secondary | ICD-10-CM | POA: Diagnosis not present

## 2023-01-26 DIAGNOSIS — O99011 Anemia complicating pregnancy, first trimester: Secondary | ICD-10-CM | POA: Diagnosis not present

## 2023-01-26 DIAGNOSIS — Z1339 Encounter for screening examination for other mental health and behavioral disorders: Secondary | ICD-10-CM

## 2023-01-26 DIAGNOSIS — Z3480 Encounter for supervision of other normal pregnancy, unspecified trimester: Secondary | ICD-10-CM

## 2023-01-26 DIAGNOSIS — O99013 Anemia complicating pregnancy, third trimester: Secondary | ICD-10-CM | POA: Insufficient documentation

## 2023-01-26 DIAGNOSIS — Z6791 Unspecified blood type, Rh negative: Secondary | ICD-10-CM

## 2023-01-26 DIAGNOSIS — O26891 Other specified pregnancy related conditions, first trimester: Secondary | ICD-10-CM

## 2023-01-26 DIAGNOSIS — O99012 Anemia complicating pregnancy, second trimester: Secondary | ICD-10-CM | POA: Insufficient documentation

## 2023-01-26 MED ORDER — FERRIC MALTOL 30 MG PO CAPS
1.0000 | ORAL_CAPSULE | Freq: Two times a day (BID) | ORAL | 2 refills | Status: DC
Start: 2023-01-26 — End: 2023-03-23

## 2023-01-26 MED ORDER — PROMETHAZINE HCL 25 MG PO TABS
25.0000 mg | ORAL_TABLET | Freq: Four times a day (QID) | ORAL | 2 refills | Status: DC | PRN
Start: 2023-01-26 — End: 2023-04-26

## 2023-01-26 MED ORDER — ASPIRIN 81 MG PO TBEC
81.0000 mg | DELAYED_RELEASE_TABLET | Freq: Every day | ORAL | 2 refills | Status: DC
Start: 2023-01-26 — End: 2023-08-20

## 2023-01-26 MED ORDER — ONDANSETRON 4 MG PO TBDP
4.0000 mg | ORAL_TABLET | Freq: Four times a day (QID) | ORAL | 2 refills | Status: DC | PRN
Start: 2023-01-26 — End: 2023-04-21

## 2023-01-26 NOTE — Progress Notes (Signed)
History:   Amy Bond is a 22 y.o. G2P0010 at [redacted]w[redacted]d by 7 week ultrasound not consistent with LMP being seen today for her first obstetrical visit.  Her obstetrical history is significant for  one SAB . Patient does intend to breast feed.  Here with FOB.   Patient reports occasional bilateral leg pain/cramping.  Also reports nausea and vomiting would like medication.  Was seen in MAU on 6/12 for abdominal pain, no pain now.  Also seen  for spotting on 12/18/22, has since resolved.  No other concerns.       HISTORY: OB History  Gravida Para Term Preterm AB Living  2 0 0 0 1 0  SAB IAB Ectopic Multiple Live Births  1 0 0 0 0    # Outcome Date GA Lbr Len/2nd Weight Sex Type Anes PTL Lv  2 Current           1 SAB 05/07/22           Past Medical History:  Diagnosis Date   Anemia    Anxiety    UTI (urinary tract infection)    Past Surgical History:  Procedure Laterality Date   NO PAST SURGERIES     Family History  Problem Relation Age of Onset   Healthy Mother    Other Mother        2021- killed   Healthy Father    Social History   Tobacco Use   Smoking status: Never   Smokeless tobacco: Never   Tobacco comments:    Quit vaping May 2024  Vaping Use   Vaping status: Former  Substance Use Topics   Alcohol use: Never   Drug use: Never   No Known Allergies Current Outpatient Medications on File Prior to Visit  Medication Sig Dispense Refill   Prenatal Vit-Fe Fumarate-FA (PRENATAL MULTIVITAMIN) TABS tablet Take 1 tablet by mouth daily at 12 noon.     No current facility-administered medications on file prior to visit.    Review of Systems Pertinent items noted in HPI and remainder of comprehensive ROS otherwise negative.  Indications for ASA therapy (per UpToDate) Two or more of the following: Nulliparity Yes Sociodemographic characteristics (African American race, low socioeconomic level) Yes  Indications for early GDM screening (FBS, A1C, Random CBG,  GTT) High-risk race/ethnicity (eg, African American, Latino, Native American, Asian American, Pacific Islander) Yes  Physical Exam:   Vitals:   01/26/23 1006  BP: 128/79  Pulse: 78  Weight: 128 lb (58.1 kg)   Fetal Heart Rate (bpm): 164   General: well-developed, well-nourished female in no acute distress  Breasts:  deferred  Skin: normal coloration and turgor, no rashes  Neurologic: oriented, normal, negative, normal mood  Extremities: normal strength, tone, and muscle mass, ROM of all joints is normal  HEENT PERRLA, extraocular movement intact and sclera clear, anicteric  Neck supple and no masses  Cardiovascular: regular rate and rhythm  Respiratory:  no respiratory distress, normal breath sounds  Abdomen: soft, non-tender; bowel sounds normal; no masses,  no organomegaly  Pelvic: deferred    Assessment:    Pregnancy: G2P0010 Patient Active Problem List   Diagnosis Date Noted   Anemia affecting pregnancy in first trimester 01/26/2023   Supervision of other normal pregnancy, antepartum 01/05/2023   Rh negative status during pregnancy 06/16/2022     Plan:    1. Nausea and vomiting during pregnancy Medications prescribed, labs checked. - ondansetron (ZOFRAN-ODT) 4 MG disintegrating tablet; Take 1 tablet (  4 mg total) by mouth every 6 (six) hours as needed for nausea.  Dispense: 20 tablet; Refill: 2 - promethazine (PHENERGAN) 25 MG tablet; Take 1 tablet (25 mg total) by mouth every 6 (six) hours as needed for nausea or vomiting.  Dispense: 30 tablet; Refill: 2 - Comprehensive metabolic panel - TSH Rfx on Abnormal to Free T4  2. Anemia affecting pregnancy in first trimester Iron therapy prescribed given her hemoglobin was 9.2 in MAU, likely cause of her leg cramping given her negative exam. Will also check other anemia labs, will follow up results and manage accordingly. - Ferritin - B12 and Folate Panel - Ferric Maltol 30 MG CAPS; Take 1 capsule (30 mg total) by mouth 2  (two) times daily. Please take one hour before breakfast and dinner  Dispense: 60 capsule; Refill: 2  3. Rh negative status during pregnancy in first trimester Rhogam later in pregnancy if indicated.  4. [redacted] weeks gestation of pregnancy 5. Supervision of other normal pregnancy, antepartum - CBC/D/Plt+RPR+Rh+ABO+RubIgG... - Culture, OB Urine - PANORAMA PRENATAL TEST - HORIZON Basic Panel - Comprehensive metabolic panel - Hemoglobin A1c - [place] MFM OB COMP + 14 WK; Future - aspirin EC 81 MG tablet; Take 1 tablet (81 mg total) by mouth at bedtime. Start taking when you are [redacted] weeks pregnant for rest of pregnancy for prevention of preeclampsia  Dispense: 300 tablet; Refill: 2  Initial labs drawn. Continue prenatal vitamins. Problem list reviewed and updated. Genetic Screening discussed, Panorama and Horizon: ordered. Ultrasound discussed; fetal anatomic survey: scheduled. Anticipatory guidance about prenatal visits given including labs, ultrasounds, and testing. Weight gain recommendations per IOM guidelines reviewed: underweight/BMI 18.5 or less > 28 - 40 lbs; normal weight/BMI 18.5 - 24.9 > 25 - 35 lbs; overweight/BMI 25 - 29.9 > 15 - 25 lbs; obese/BMI  30 or more > 11 - 20 lbs. Discussed usage of the Babyscripts app for more information about pregnancy, and to track blood pressures. Also discussed usage of virtual visits as additional source of managing and completing prenatal visits.  Patient was encouraged to use MyChart to review results, send requests, and have questions addressed.   The nature of Center for [facility] with multiple MDs and Advanced Practice Providers was explained to patient; also emphasized that residents, students are part of our team. Routine obstetric precautions reviewed. Encouraged to seek out care at our office or emergency room [facility] MAU preferred) for urgent and/or emergent concerns. Return in about 4 weeks (around 02/23/2023) for OFFICE OB  VISIT (MD or APP).     Jaynie Collins, MD, FACOG Obstetrician & Gynecologist, [facility] for [employer], [facility]

## 2023-02-09 ENCOUNTER — Other Ambulatory Visit: Payer: Self-pay | Admitting: Obstetrics and Gynecology

## 2023-02-09 DIAGNOSIS — Z3480 Encounter for supervision of other normal pregnancy, unspecified trimester: Secondary | ICD-10-CM

## 2023-02-09 DIAGNOSIS — D563 Thalassemia minor: Secondary | ICD-10-CM | POA: Insufficient documentation

## 2023-02-12 ENCOUNTER — Inpatient Hospital Stay (HOSPITAL_COMMUNITY)
Admission: AD | Admit: 2023-02-12 | Discharge: 2023-02-12 | Discharge status: Against Medical Advice | Payer: Medicaid Other | Attending: Obstetrics and Gynecology | Admitting: Obstetrics and Gynecology

## 2023-02-12 DIAGNOSIS — R1013 Epigastric pain: Secondary | ICD-10-CM | POA: Diagnosis not present

## 2023-02-12 NOTE — Progress Notes (Signed)
Called a second time to be put into a room, again NA.

## 2023-02-12 NOTE — Progress Notes (Signed)
Pt called a 3rd time, again no answer.  Will discharge off MAU census board.

## 2023-02-12 NOTE — Progress Notes (Signed)
Pt called from lobby to be placed into room for evaluation, no answer.

## 2023-02-12 NOTE — MAU Note (Signed)
.  Amy Bond is a 22 y.o. at [redacted]w[redacted]d here in MAU reporting: Sharp upper abdominal/epigastric pain that has been intermittent for the past two days. She reports the pain occurs mostly when she eats and after vomiting. She reports when the food passes over the area in her abdomen as she swallows, it feels like a stabbing pain. She reports she also experiencing thick white discharge accompanied by vaginal itching for the past 2-3 days. Denies recent IC. Denies VB. Denies current pain.  Next OB appointment 8/27.  Onset of complaint: x2 days Pain score: 5/10 epigastric when the pain occurs, denies current pain. Last felt the pain this morning.   FHT: 154 doppler Lab orders placed from triage: none

## 2023-02-15 ENCOUNTER — Telehealth: Payer: Self-pay

## 2023-02-15 NOTE — Telephone Encounter (Signed)
Attempted to reach patient regarding message left with answering service Left voicemail for patient to call office or send mychart message with details regarding appointment needs.   

## 2023-02-23 ENCOUNTER — Ambulatory Visit: Payer: Medicaid Other | Admitting: Family Medicine

## 2023-02-23 VITALS — BP 101/67 | HR 82 | Wt 125.4 lb

## 2023-02-23 DIAGNOSIS — Z3480 Encounter for supervision of other normal pregnancy, unspecified trimester: Secondary | ICD-10-CM

## 2023-02-23 DIAGNOSIS — Z6791 Unspecified blood type, Rh negative: Secondary | ICD-10-CM

## 2023-02-23 DIAGNOSIS — O26892 Other specified pregnancy related conditions, second trimester: Secondary | ICD-10-CM

## 2023-02-23 DIAGNOSIS — O99011 Anemia complicating pregnancy, first trimester: Secondary | ICD-10-CM

## 2023-02-23 DIAGNOSIS — D563 Thalassemia minor: Secondary | ICD-10-CM

## 2023-02-23 NOTE — Progress Notes (Signed)
   PRENATAL VISIT NOTE  Subjective:  Amy Bond is a 22 y.o. G2P0010 at [redacted]w[redacted]d being seen today for ongoing prenatal care.  She is currently monitored for the following issues for this low-risk pregnancy and has Rh negative status during pregnancy; Supervision of other normal pregnancy, antepartum; Anemia affecting pregnancy in first trimester; and Alpha thalassemia silent carrier on their problem list.  Patient reports no complaints.  Contractions: Not present. Vag. Bleeding: None.   . Denies leaking of fluid.   The following portions of the patient's history were reviewed and updated as appropriate: allergies, current medications, past family history, past medical history, past social history, past surgical history and problem list.   Objective:   Vitals:   02/23/23 1340  BP: 101/67  Pulse: 82  Weight: 125 lb 6.4 oz (56.9 kg)    Fetal Status: Fetal Heart Rate (bpm): 143         General:  Alert, oriented and cooperative. Patient is in no acute distress.  Skin: Skin is warm and dry. No rash noted.   Cardiovascular: Normal heart rate noted  Respiratory: Normal respiratory effort, no problems with respiration noted  Abdomen: Soft, gravid, appropriate for gestational age.  Pain/Pressure: Absent     Pelvic: Cervical exam deferred        Extremities: Normal range of motion.  Edema: None  Mental Status: Normal mood and affect. Normal behavior. Normal judgment and thought content.   Assessment and Plan:  Pregnancy: G2P0010 at [redacted]w[redacted]d 1. Supervision of other normal pregnancy, antepartum Continue routine prenatal care.  2. Rh negative status during pregnancy in second trimester Baby is Rh Negative according to [employer]  3. Anemia affecting pregnancy in first trimester On iron  4. Alpha thalassemia silent carrier   General obstetric precautions including but not limited to vaginal bleeding, contractions, leaking of fluid and fetal movement were reviewed in detail with the  patient. Please refer to After Visit Summary for other counseling recommendations.   Return in 4 weeks (on 03/23/2023).  Future Appointments  Date Time Provider Department Center  03/23/2023  1:30 PM [place] Bing, MD [facility]  03/29/2023  9:15 AM [facility] NURSE [facility]  03/29/2023  9:30 AM [facility] US[facility]  04/20/2023  2:30 PM Anyanwu, Jethro Bastos, MD [facility]    Reva Bores, MD

## 2023-02-23 NOTE — Progress Notes (Signed)
ROB: denies any concerns

## 2023-03-23 ENCOUNTER — Ambulatory Visit (INDEPENDENT_AMBULATORY_CARE_PROVIDER_SITE_OTHER): Payer: Medicaid Other | Admitting: Obstetrics and Gynecology

## 2023-03-23 VITALS — BP 107/66 | HR 82 | Wt 126.0 lb

## 2023-03-23 DIAGNOSIS — Z6791 Unspecified blood type, Rh negative: Secondary | ICD-10-CM

## 2023-03-23 DIAGNOSIS — O26892 Other specified pregnancy related conditions, second trimester: Secondary | ICD-10-CM

## 2023-03-23 DIAGNOSIS — O99011 Anemia complicating pregnancy, first trimester: Secondary | ICD-10-CM

## 2023-03-23 DIAGNOSIS — Z3A18 18 weeks gestation of pregnancy: Secondary | ICD-10-CM

## 2023-03-23 DIAGNOSIS — Z3480 Encounter for supervision of other normal pregnancy, unspecified trimester: Secondary | ICD-10-CM

## 2023-03-23 MED ORDER — FERRIC MALTOL 30 MG PO CAPS
1.0000 | ORAL_CAPSULE | ORAL | Status: DC
Start: 2023-03-23 — End: 2023-08-04

## 2023-03-23 NOTE — Progress Notes (Signed)
   PRENATAL VISIT NOTE  Subjective:  Amy Bond is a 22 y.o. G2P0010 at [redacted]w[redacted]d being seen today for ongoing prenatal care.  She is currently monitored for the following issues for this low-risk pregnancy and has Rh negative status during pregnancy; Supervision of other normal pregnancy, antepartum; Anemia affecting pregnancy in first trimester; and Alpha thalassemia silent carrier on their problem list.  Patient reports no complaints.  Contractions: Not present. Vag. Bleeding: None.  Movement: Present. Denies leaking of fluid.   The following portions of the patient's history were reviewed and updated as appropriate: allergies, current medications, past family history, past medical history, past social history, past surgical history and problem list.   Objective:   Vitals:   03/23/23 1337  BP: 107/66  Pulse: 82  Weight: 126 lb (57.2 kg)    Fetal Status: Fetal Heart Rate (bpm): 144   Movement: Present     General:  Alert, oriented and cooperative. Patient is in no acute distress.  Skin: Skin is warm and dry. No rash noted.   Cardiovascular: Normal heart rate noted  Respiratory: Normal respiratory effort, no problems with respiration noted  Abdomen: Soft, gravid, appropriate for gestational age.  Pain/Pressure: Absent     Pelvic: Cervical exam deferred        Extremities: Normal range of motion.     Mental Status: Normal mood and affect. Normal behavior. Normal judgment and thought content.   Assessment and Plan:  Pregnancy: G2P0010 at [redacted]w[redacted]d 1. Anemia affecting pregnancy in first trimester Continue po iron  - Ferric Maltol 30 MG CAPS; Take 1 capsule (30 mg total) by mouth every other day. Please take one hour before breakfast or dinner  2. [redacted] weeks gestation of pregnancy Declines afp. F/u mfm anatomy u/s. Total weight gain goal d/w her  3. Rh negative status during pregnancy in second trimester  4. Supervision of other normal pregnancy, antepartum  Preterm labor symptoms  and general obstetric precautions including but not limited to vaginal bleeding, contractions, leaking of fluid and fetal movement were reviewed in detail with the patient. Please refer to After Visit Summary for other counseling recommendations.   No follow-ups on file.  Future Appointments  Date Time Provider Department Center  03/29/2023  9:15 AM [facility]  03/29/2023  9:30 AM [facility] US4 [facility]  03/29/2023 12:00 PM [facility] GENETIC RM [facility] None  04/21/2023  3:50 PM Reva Bores, MD [facility]  05/18/2023  8:30 AM [facility] LAB [facility]  05/18/2023  8:55 AM Anyanwu, Jethro Bastos, MD [facility]    [place] Bing, MD

## 2023-03-29 ENCOUNTER — Other Ambulatory Visit: Payer: Self-pay

## 2023-03-29 ENCOUNTER — Ambulatory Visit: Payer: Medicaid Other | Attending: Obstetrics & Gynecology

## 2023-03-29 ENCOUNTER — Other Ambulatory Visit: Payer: Self-pay | Admitting: Obstetrics & Gynecology

## 2023-03-29 ENCOUNTER — Encounter: Payer: Self-pay | Admitting: Obstetrics & Gynecology

## 2023-03-29 ENCOUNTER — Encounter: Payer: Self-pay | Admitting: *Deleted

## 2023-03-29 ENCOUNTER — Ambulatory Visit: Payer: Medicaid Other

## 2023-03-29 ENCOUNTER — Encounter: Payer: Self-pay | Admitting: Obstetrics and Gynecology

## 2023-03-29 ENCOUNTER — Other Ambulatory Visit: Payer: Self-pay | Admitting: *Deleted

## 2023-03-29 ENCOUNTER — Ambulatory Visit: Payer: Medicaid Other | Admitting: *Deleted

## 2023-03-29 VITALS — BP 122/70 | HR 93

## 2023-03-29 DIAGNOSIS — D563 Thalassemia minor: Secondary | ICD-10-CM

## 2023-03-29 DIAGNOSIS — Z363 Encounter for antenatal screening for malformations: Secondary | ICD-10-CM | POA: Insufficient documentation

## 2023-03-29 DIAGNOSIS — O26892 Other specified pregnancy related conditions, second trimester: Secondary | ICD-10-CM | POA: Diagnosis not present

## 2023-03-29 DIAGNOSIS — Z148 Genetic carrier of other disease: Secondary | ICD-10-CM | POA: Insufficient documentation

## 2023-03-29 DIAGNOSIS — O99012 Anemia complicating pregnancy, second trimester: Secondary | ICD-10-CM | POA: Insufficient documentation

## 2023-03-29 DIAGNOSIS — Z3A19 19 weeks gestation of pregnancy: Secondary | ICD-10-CM | POA: Insufficient documentation

## 2023-03-29 DIAGNOSIS — Z3480 Encounter for supervision of other normal pregnancy, unspecified trimester: Secondary | ICD-10-CM | POA: Diagnosis not present

## 2023-03-29 DIAGNOSIS — O321XX0 Maternal care for breech presentation, not applicable or unspecified: Secondary | ICD-10-CM | POA: Insufficient documentation

## 2023-03-29 DIAGNOSIS — Z6791 Unspecified blood type, Rh negative: Secondary | ICD-10-CM | POA: Diagnosis not present

## 2023-03-29 DIAGNOSIS — O99011 Anemia complicating pregnancy, first trimester: Secondary | ICD-10-CM

## 2023-03-29 DIAGNOSIS — Z3A10 10 weeks gestation of pregnancy: Secondary | ICD-10-CM

## 2023-03-29 DIAGNOSIS — N856 Intrauterine synechiae: Secondary | ICD-10-CM | POA: Insufficient documentation

## 2023-03-29 DIAGNOSIS — O09899 Supervision of other high risk pregnancies, unspecified trimester: Secondary | ICD-10-CM | POA: Insufficient documentation

## 2023-03-29 DIAGNOSIS — O219 Vomiting of pregnancy, unspecified: Secondary | ICD-10-CM

## 2023-03-29 HISTORY — DX: Thalassemia minor: D56.3

## 2023-04-20 ENCOUNTER — Encounter: Payer: Medicaid Other | Admitting: Obstetrics & Gynecology

## 2023-04-21 ENCOUNTER — Ambulatory Visit (INDEPENDENT_AMBULATORY_CARE_PROVIDER_SITE_OTHER): Payer: Medicaid Other | Admitting: Family Medicine

## 2023-04-21 VITALS — BP 115/76 | HR 78 | Wt 127.0 lb

## 2023-04-21 DIAGNOSIS — Z3A22 22 weeks gestation of pregnancy: Secondary | ICD-10-CM

## 2023-04-21 DIAGNOSIS — Z6791 Unspecified blood type, Rh negative: Secondary | ICD-10-CM

## 2023-04-21 DIAGNOSIS — O26892 Other specified pregnancy related conditions, second trimester: Secondary | ICD-10-CM

## 2023-04-21 DIAGNOSIS — Z3480 Encounter for supervision of other normal pregnancy, unspecified trimester: Secondary | ICD-10-CM

## 2023-04-21 DIAGNOSIS — O219 Vomiting of pregnancy, unspecified: Secondary | ICD-10-CM

## 2023-04-21 MED ORDER — ONDANSETRON 4 MG PO TBDP: 4.0000 mg | ORAL_TABLET | Freq: Four times a day (QID) | ORAL | 2 refills | Status: DC | PRN

## 2023-04-21 MED ORDER — SCOPOLAMINE 1 MG/3DAYS TD PT72: 1.0000 | MEDICATED_PATCH | TRANSDERMAL | 12 refills | Status: DC

## 2023-04-21 NOTE — Progress Notes (Signed)
ROB: Pt stating that she is still having to rely on her nausea medication, unable to eat and keep anything down without the medication.    Possibly needing refills of the Zofran

## 2023-04-21 NOTE — Progress Notes (Signed)
   PRENATAL VISIT NOTE  Subjective:  Amy Bond is a 22 y.o. G2P0010 at [redacted]w[redacted]d being seen today for ongoing prenatal care.  She is currently monitored for the following issues for this low-risk pregnancy and has Rh negative status during pregnancy; Supervision of other normal pregnancy, antepartum; Anemia affecting pregnancy in first trimester; Thalassemia alpha carrier; Intrauterine synechiae; and Two vessel umbilical cord, antepartum on their problem list.  Patient reports no complaints.  Contractions: Not present. Vag. Bleeding: None.  Movement: Present. Denies leaking of fluid.   The following portions of the patient's history were reviewed and updated as appropriate: allergies, current medications, past family history, past medical history, past social history, past surgical history and problem list.   Objective:   Vitals:   04/21/23 1550  BP: 115/76  Pulse: 78  Weight: 127 lb (57.6 kg)    Fetal Status: Fetal Heart Rate (bpm): 138 Fundal Height: 24 cm Movement: Present     General:  Alert, oriented and cooperative. Patient is in no acute distress.  Skin: Skin is warm and dry. No rash noted.   Cardiovascular: Normal heart rate noted  Respiratory: Normal respiratory effort, no problems with respiration noted  Abdomen: Soft, gravid, appropriate for gestational age.  Pain/Pressure: Absent     Pelvic: Cervical exam deferred        Extremities: Normal range of motion.  Edema: None  Mental Status: Normal mood and affect. Normal behavior. Normal judgment and thought content.   Assessment and Plan:  Pregnancy: G2P0010 at [redacted]w[redacted]d 1. Supervision of other normal pregnancy, antepartum Continue routine prenatal care.  2. Rh negative status during pregnancy in second trimester Baby is negative according to [employer]  3. [redacted] weeks gestation of pregnancy   4. Nausea and vomiting during pregnancy Trial of scopolamine, refilled Zofran - scopolamine (TRANSDERM-SCOP) 1 MG/3DAYS; Place 1  patch (1.5 mg total) onto the skin every 3 (three) days.  Dispense: 10 patch; Refill: 12 - ondansetron (ZOFRAN-ODT) 4 MG disintegrating tablet; Take 1 tablet (4 mg total) by mouth every 6 (six) hours as needed for nausea.  Dispense: 20 tablet; Refill: 2  5. Carrier of Alpha Thal Partner testing completed today  General obstetric precautions including but not limited to vaginal bleeding, contractions, leaking of fluid and fetal movement were reviewed in detail with the patient. Please refer to After Visit Summary for other counseling recommendations.   Return in 4 weeks (on 05/19/2023).  Future Appointments  Date Time Provider Department Center  05/03/2023 10:30 AM [facility]  05/18/2023  8:30 AM [facility] LAB [facility]  05/18/2023  8:55 AM Anyanwu, Jethro Bastos, MD [facility]  06/01/2023  3:50 PM Anyanwu, Jethro Bastos, MD [facility]  06/15/2023  3:50 PM Anyanwu, Jethro Bastos, MD [facility]  06/29/2023  3:50 PM Anyanwu, Jethro Bastos, MD [facility]    Reva Bores, MD

## 2023-04-26 ENCOUNTER — Observation Stay (HOSPITAL_COMMUNITY)
Admission: AD | Admit: 2023-04-26 | Discharge: 2023-04-27 | Disposition: A | Discharge status: Home / Self Care | Payer: Medicaid Other | Attending: Obstetrics and Gynecology | Admitting: Obstetrics and Gynecology

## 2023-04-26 ENCOUNTER — Inpatient Hospital Stay (HOSPITAL_COMMUNITY): Payer: Medicaid Other

## 2023-04-26 ENCOUNTER — Encounter (HOSPITAL_COMMUNITY): Payer: Self-pay | Admitting: Obstetrics and Gynecology

## 2023-04-26 ENCOUNTER — Other Ambulatory Visit: Payer: Self-pay

## 2023-04-26 DIAGNOSIS — O219 Vomiting of pregnancy, unspecified: Secondary | ICD-10-CM

## 2023-04-26 DIAGNOSIS — O99012 Anemia complicating pregnancy, second trimester: Secondary | ICD-10-CM

## 2023-04-26 DIAGNOSIS — Z7982 Long term (current) use of aspirin: Secondary | ICD-10-CM | POA: Diagnosis not present

## 2023-04-26 DIAGNOSIS — O26892 Other specified pregnancy related conditions, second trimester: Secondary | ICD-10-CM | POA: Insufficient documentation

## 2023-04-26 DIAGNOSIS — O360120 Maternal care for anti-D [Rh] antibodies, second trimester, not applicable or unspecified: Secondary | ICD-10-CM | POA: Diagnosis not present

## 2023-04-26 DIAGNOSIS — O368320 Maternal care for abnormalities of the fetal heart rate or rhythm, second trimester, not applicable or unspecified: Secondary | ICD-10-CM

## 2023-04-26 DIAGNOSIS — Z23 Encounter for immunization: Secondary | ICD-10-CM | POA: Diagnosis not present

## 2023-04-26 DIAGNOSIS — R109 Unspecified abdominal pain: Secondary | ICD-10-CM | POA: Diagnosis not present

## 2023-04-26 DIAGNOSIS — O43192 Other malformation of placenta, second trimester: Secondary | ICD-10-CM | POA: Diagnosis not present

## 2023-04-26 DIAGNOSIS — O285 Abnormal chromosomal and genetic finding on antenatal screening of mother: Secondary | ICD-10-CM

## 2023-04-26 DIAGNOSIS — D649 Anemia, unspecified: Secondary | ICD-10-CM

## 2023-04-26 DIAGNOSIS — R1030 Lower abdominal pain, unspecified: Secondary | ICD-10-CM | POA: Diagnosis not present

## 2023-04-26 DIAGNOSIS — D563 Thalassemia minor: Secondary | ICD-10-CM

## 2023-04-26 DIAGNOSIS — Z3480 Encounter for supervision of other normal pregnancy, unspecified trimester: Principal | ICD-10-CM

## 2023-04-26 DIAGNOSIS — Z3A23 23 weeks gestation of pregnancy: Secondary | ICD-10-CM

## 2023-04-26 DIAGNOSIS — O368390 Maternal care for abnormalities of the fetal heart rate or rhythm, unspecified trimester, not applicable or unspecified: Secondary | ICD-10-CM | POA: Diagnosis present

## 2023-04-26 DIAGNOSIS — O09899 Supervision of other high risk pregnancies, unspecified trimester: Secondary | ICD-10-CM

## 2023-04-26 DIAGNOSIS — N856 Intrauterine synechiae: Secondary | ICD-10-CM

## 2023-04-26 LAB — URINALYSIS, ROUTINE W REFLEX MICROSCOPIC
Bilirubin Urine: NEGATIVE
Glucose, UA: NEGATIVE mg/dL
Hgb urine dipstick: NEGATIVE
Ketones, ur: 20 mg/dL — AB
Nitrite: NEGATIVE
Protein, ur: NEGATIVE mg/dL
Specific Gravity, Urine: 1.013 (ref 1.005–1.030)
pH: 6 (ref 5.0–8.0)

## 2023-04-26 LAB — COMPREHENSIVE METABOLIC PANEL
ALT: 10 U/L (ref 0–44)
AST: 23 U/L (ref 15–41)
Albumin: 3.4 g/dL — ABNORMAL LOW (ref 3.5–5.0)
Alkaline Phosphatase: 74 U/L (ref 38–126)
Anion gap: 9 (ref 5–15)
BUN: <5 mg/dL — ABNORMAL LOW (ref 6–20)
CO2: 21 mmol/L — ABNORMAL LOW (ref 22–32)
Calcium: 9.2 mg/dL (ref 8.9–10.3)
Chloride: 106 mmol/L (ref 98–111)
Creatinine, Ser: 0.7 mg/dL (ref 0.44–1.00)
GFR, Estimated: >60 mL/min (ref >60)
Glucose, Bld: 80 mg/dL (ref 70–99)
Potassium: 3.4 mmol/L — ABNORMAL LOW (ref 3.5–5.1)
Sodium: 136 mmol/L (ref 135–145)
Total Bilirubin: 0.5 mg/dL (ref 0.3–1.2)
Total Protein: 7.3 g/dL (ref 6.5–8.1)

## 2023-04-26 LAB — CBC
HCT: 31.5 % — ABNORMAL LOW (ref 36.0–46.0)
Hemoglobin: 9.7 g/dL — ABNORMAL LOW (ref 12.0–15.0)
MCH: 22.4 pg — ABNORMAL LOW (ref 26.0–34.0)
MCHC: 30.8 g/dL (ref 30.0–36.0)
MCV: 72.7 fL — ABNORMAL LOW (ref 80.0–100.0)
Platelets: 242 10*3/uL (ref 150–400)
RBC: 4.33 MIL/uL (ref 3.87–5.11)
RDW: 14.6 % (ref 11.5–15.5)
WBC: 7.4 10*3/uL (ref 4.0–10.5)
nRBC: 0 % (ref 0.0–0.2)

## 2023-04-26 LAB — WET PREP, GENITAL
Clue Cells Wet Prep HPF POC: NONE SEEN
Sperm: NONE SEEN
Trich, Wet Prep: NONE SEEN
WBC, Wet Prep HPF POC: >=10 — AB (ref <10)
Yeast Wet Prep HPF POC: NONE SEEN

## 2023-04-26 LAB — TYPE AND SCREEN
ABO/RH(D): O NEG
Antibody Screen: NEGATIVE

## 2023-04-26 MED ORDER — ACETAMINOPHEN 500 MG PO TABS
1000.0000 mg | ORAL_TABLET | Freq: Once | ORAL | Status: AC
  Administered 2023-04-26: 1000 mg via ORAL
  Filled 2023-04-26: qty 2

## 2023-04-26 MED ORDER — DOCUSATE SODIUM 100 MG PO CAPS
100.0000 mg | ORAL_CAPSULE | Freq: Every day | ORAL | Status: DC
  Administered 2023-04-27: 100 mg via ORAL
  Filled 2023-04-26: qty 1

## 2023-04-26 MED ORDER — CALCIUM CARBONATE ANTACID 500 MG PO CHEW: 2.0000 | CHEWABLE_TABLET | ORAL | Status: DC | PRN

## 2023-04-26 MED ORDER — PRENATAL MULTIVITAMIN CH: 1.0000 | ORAL_TABLET | Freq: Every day | ORAL | Status: DC

## 2023-04-26 MED ORDER — ASPIRIN 81 MG PO TBEC
81.0000 mg | DELAYED_RELEASE_TABLET | Freq: Every day | ORAL | Status: DC
  Administered 2023-04-26: 81 mg via ORAL
  Filled 2023-04-26: qty 1

## 2023-04-26 MED ORDER — CYCLOBENZAPRINE HCL 10 MG PO TABS
5.0000 mg | ORAL_TABLET | Freq: Once | ORAL | Status: AC
  Administered 2023-04-26: 5 mg via ORAL
  Filled 2023-04-26: qty 1

## 2023-04-26 MED ORDER — PRENATAL MULTIVITAMIN CH
1.0000 | ORAL_TABLET | Freq: Every day | ORAL | Status: DC
  Administered 2023-04-27: 1 via ORAL
  Filled 2023-04-26: qty 1

## 2023-04-26 MED ORDER — SODIUM CHLORIDE 0.9% FLUSH
10.0000 mL | Freq: Two times a day (BID) | INTRAVENOUS | Status: DC
  Administered 2023-04-26 – 2023-04-27 (×2): 10 mL via INTRAVENOUS

## 2023-04-26 MED ORDER — ACETAMINOPHEN 325 MG PO TABS
650.0000 mg | ORAL_TABLET | ORAL | Status: DC | PRN
  Administered 2023-04-27: 650 mg via ORAL
  Filled 2023-04-26: qty 2

## 2023-04-26 MED ORDER — INFLUENZA VIRUS VACC SPLIT PF (FLUZONE) 0.5 ML IM SUSY
0.5000 mL | PREFILLED_SYRINGE | INTRAMUSCULAR | Status: AC
  Administered 2023-04-27: 0.5 mL via INTRAMUSCULAR
  Filled 2023-04-26: qty 0.5

## 2023-04-26 MED ORDER — LACTATED RINGERS IV BOLUS
1000.0000 mL | Freq: Once | INTRAVENOUS | Status: AC
  Administered 2023-04-26: 1000 mL via INTRAVENOUS

## 2023-04-26 NOTE — Consult Note (Signed)
MFM Note  Amy Bond is currently at 23 weeks and 0 days.  Amy Bond presented to the hospital today complaining of lower abdominal cramping.  Fetal tachycardia with a heart rate in the 220s to 230s was noted on fetal monitoring.  An ultrasound performed this evening did not show any signs of fetal hydrops.  Fetal tachycardia with a fetal heart rate in the 220s-230s was noted throughout the ultrasound exam.  Fetal movements including fetal breathing movements were noted throughout the ultrasound exam.  The overall EFW measured today was 1 pound 5 ounces (71st percentile).  During her fetal anatomy scan performed in our office last month, there were no obvious fetal cardiac defects noted.  A two-vessel umbilical cord was noted during that exam.  The fetal tachycardia resolved for a few minutes while the patient was on continuous monitoring in the MAU.  As the fetal status is currently reassuring and there are no signs of hydrops, we will keep the patient on continuous monitoring overnight to determine if the fetal tachycardia remains continuous or is intermittent.    Should the fetal tachycardia remain constant and persists, we will consult with pediatric cardiology tomorrow morning to determine if treatment with maternal oral digoxin is indicated.    Amy Bond should remain on continuous monitoring overnight.

## 2023-04-26 NOTE — H&P (Signed)
FACULTY PRACTICE ANTEPARTUM ADMISSION HISTORY AND PHYSICAL NOTE   History of Present Illness: Amy Bond is a 22 y.o. G2P0010 at [redacted]w[redacted]d admitted for fetal tachycardia.    Patient initially presented with abdominal pain and tightening. Mostly R sided, often starts in her belly and radiates down or starts in her back and wraps around to the R front. Reports the pain is intermittent, lasting a few seconds. Denies burning or pain with urination. No vaginal discharge. No vaginal loss of fluid or bleeding. Fetal movement is normal. Denies fevers.    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   Anemia affecting pregnancy in first trimester 01/26/2023   Supervision of other normal pregnancy, antepartum 01/05/2023   Rh negative status during pregnancy 06/16/2022    Past Medical History:  Diagnosis Date   Anemia    Anxiety    UTI (urinary tract infection)     Past Surgical History:  Procedure Laterality Date   NO PAST SURGERIES      OB History  Gravida Para Term Preterm AB Living  2       1    SAB IAB Ectopic Multiple Live Births  1            # Outcome Date GA Lbr Len/2nd Weight Sex Type Anes PTL Lv  2 Current           1 SAB 05/07/22            Social History   Socioeconomic History   Marital status: Single    Spouse name: Not on file   Number of children: Not on file   Years of education: Not on file   Highest education level: Not on file  Occupational History   Not on file  Tobacco Use   Smoking status: Never   Smokeless tobacco: Never   Tobacco comments:    Quit vaping May 2024  Vaping Use   Vaping status: Former   Substances: Nicotine, Flavoring  Substance and Sexual Activity   Alcohol use: Never   Drug use: Never   Sexual activity: Yes  Other Topics Concern   Not on file  Social History Narrative   Not on file    Social Determinants of Health   Financial Resource Strain: Not on file  Food Insecurity: Not on file  Transportation Needs: Not on file  Physical Activity: Not on file  Stress: Not on file  Social Connections: Not on file    Family History  Problem Relation Age of Onset   Healthy Mother    Other Mother        2021- killed   Healthy Father     No Known Allergies  Medications Prior to Admission  Medication Sig Dispense Refill Last Dose   aspirin EC 81 MG tablet Take 1 tablet (81 mg total) by mouth at bedtime. Start taking when you are [redacted] weeks pregnant for rest of pregnancy for prevention of preeclampsia 300 tablet 2 04/26/2023   ondansetron (ZOFRAN-ODT) 4 MG disintegrating tablet Take 1 tablet (4 mg total) by mouth every 6 (six) hours as needed for nausea. 20 tablet 2 04/26/2023   Prenatal Vit-Fe Fumarate-FA (PRENATAL MULTIVITAMIN) TABS tablet Take 1 tablet by mouth daily at 12 noon.   04/26/2023   Ferric Maltol 30 MG CAPS Take 1 capsule (30 mg total) by mouth every other day. Please  take one hour before breakfast or dinner      promethazine (PHENERGAN) 25 MG tablet Take 1 tablet (25 mg total) by mouth every 6 (six) hours as needed for nausea or vomiting. (Patient not taking: Reported on 03/29/2023) 30 tablet 2    scopolamine (TRANSDERM-SCOP) 1 MG/3DAYS Place 1 patch (1.5 mg total) onto the skin every 3 (three) days. 10 patch 12     Review of Systems  Pertinent positives and negative per HPI, all others reviewed and negative   Vitals:  BP 132/82 (BP Location: Right Arm)   Pulse 75   Temp 98.2 F (36.8 C) (Oral)   Resp 14   LMP 11/07/2022   SpO2 100%  Physical Exam Vitals reviewed.  Constitutional:      General: She is not in acute distress.    Appearance: She is well-developed. She is not diaphoretic.  Cardiovascular:     Rate and Rhythm: Normal rate and regular rhythm.     Heart sounds: Normal heart sounds. No murmur heard. Pulmonary:     Effort: Pulmonary effort  is normal. No respiratory distress.     Breath sounds: Normal breath sounds. No wheezing or rales.  Abdominal:     General: Bowel sounds are normal. There is no distension.     Palpations: Abdomen is soft.     Tenderness: There is no abdominal tenderness. There is no guarding or rebound.  Genitourinary:    Comments: Cervix long and visually closed. Normal physiologic discharge. Neg pool. SVE with closed Cervix/Thick/-- Skin:    General: Skin is warm and dry.  Neurological:     Mental Status: She is alert.     Coordination: Coordination normal.      Fetal Monitoring:Baseline: 220 bpm, Variability: Good {> 6 bpm), Accelerations: Reactive, and Decelerations: Absent Tocometer: Flat  Bedside US Pt informed that the ultrasound is considered a limited OB ultrasound and is not intended to be a complete ultrasound exam.  Patient also informed that the ultrasound is not being completed with the intent of assessing for fetal or placental anomalies or any pelvic abnormalities.  Explained that the purpose of today's ultrasound is to assess for   cardiac status and presentation.  Patient acknowledges the purpose of the exam and the limitations of the study.    My read: breech presentation. Cardiac rate does appear to be in   Labs:  Results for orders placed or performed during the hospital encounter of 04/26/23 (from the past 24 hour(s))  Wet prep, genital   Collection Time: 04/26/23  5:21 PM   Specimen: Vaginal  Result Value Ref Range   Yeast Wet Prep HPF POC NONE SEEN NONE SEEN   Trich, Wet Prep NONE SEEN NONE SEEN   Clue Cells Wet Prep HPF POC NONE SEEN NONE SEEN   WBC, Wet Prep HPF POC >=10 (A) <10   Sperm NONE SEEN     Imaging Studies: [place] MFM OB DETAIL +14 WK  Result Date: 03/29/2023 ----------------------------------------------------------------------  OBSTETRICS REPORT                       (Signed Final 03/29/2023 11:07 am)  ---------------------------------------------------------------------- Patient Info  ID #:       [id number]                          D.O.B.:  2001-06-02 (22 yrs)  Name:       Amy Bond [facility]  Visit Date: 03/29/2023 09:28 am ---------------------------------------------------------------------- Performed By  Attending:        Ma Rings MD         Ref. Address:     [address]                                                             [address]  Performed By:     Marcellina Millin       [facility]  Referred By:      [facility] ---------------------------------------------------------------------- Orders  #  Description                           Code        Ordered By  1  [place] MFM OB DETAIL +14 WK               [id number]    Jaynie Collins ----------------------------------------------------------------------  #  Order #                     Accession #                Episode #  1  [id number]                   [id number]                 [id number] ---------------------------------------------------------------------- Indications  2 vessel umbilical cord                        O69.89X0  Rh negative state in antepartum                O36.0190  Anemia during pregnancy in second trimester    O99.012  Genetic carrier (Alpha Thal CARRIER)           Z14.8  Encounter for antenatal screening for          Z36.3  malformations  [redacted] weeks gestation of pregnancy                Z3A.19  LR NIPS - Female,  declined AFP ---------------------------------------------------------------------- Vital Signs  BP:          122/70 ---------------------------------------------------------------------- Fetal Evaluation  Num Of Fetuses:         1  Fetal Heart Rate(bpm):  140  Cardiac Activity:        Observed  Presentation:           Breech  Placenta:               Right lateral  P. Cord Insertion:      Visualized, central  Amniotic Fluid  AFI FV:      Within normal limits                              Largest Pocket(cm)                              4.15 ---------------------------------------------------------------------- Biometry  BPD:      44.7  mm     G. Age:  19w 4d         72  %    CI:        75.04   %    70 - 86                                                          FL/HC:      17.7   %    16.1 - 18.3  HC:      163.7  mm     G. Age:  19w 1d         48  %    HC/AC:      1.14        1.09 - 1.39  AC:      143.7  mm     G. Age:  19w 5d         70  %    FL/BPD:     64.9   %  FL:         29  mm     G. Age:  18w 6d         40  %    FL/AC:      20.2   %    20 - 24  HUM:      28.5  mm     G. Age:  19w 1d         56  %  CER:      18.8  mm     G. Age:  18w 3d         13  %  NFT:       2.9  mm  LV:          9  mm  CM:        5.2  mm  Est. FW:     286  gm    0 lb 10 oz      65  % ---------------------------------------------------------------------- OB History  Gravidity:    2         Term:   0        Prem:   0        SAB:  1  TOP:          0       Ectopic:  0        Living: 0 ---------------------------------------------------------------------- Gestational Age  LMP:           20w 2d        Date:  11/07/22                  EDD:   08/14/23  U/S Today:     19w 2d                                        EDD:   08/21/23  Best:          19w 0d     Det. By:  U/S C R L  (01/05/23)    EDD:   08/23/23 ---------------------------------------------------------------------- Targeted Anatomy  Central Nervous System  Calvarium/Cranial V.:  Appears normal         Cereb./Vermis:          Appears normal  Cavum:                 Appears normal         Cisterna Magna:         Appears normal  Lateral Ventricles:    Appears normal         Midline Falx:           Appears normal  Choroid Plexus:        Appears normal  Spine  Cervical:               Appears normal         Sacral:                 Not well visualized  Thoracic:              Appears normal         Shape/Curvature:        Appears normal  Lumbar:                Not well visualized  Head/Neck  Lips:                  Appears normal         Profile:                Appears normal  Neck:                  Appears normal         Orbits/Eyes:            Appears normal  Nuchal Fold:           Appears normal         Mandible:               Appears normal  Nasal Bone:            Present                Maxilla:                Appears normal  Palate:                Appears normal  Thorax  4 Chamber View:        Appears normal  Interventr. Septum:     Appears normal  Cardiac Rhythm:        Normal                 Cardiac Axis:           Normal  Cardiac Situs:         Appears normal         Diaphragm:              Appears normal  Rt Outflow Tract:      Appears normal         3 Vessel View:          Appears normal  Lt Outflow Tract:      Appears normal         3 V Trachea View:       Appears normal  Aortic Arch:           Appears normal         IVC:                    Appears normal  Ductal Arch:           Appears normal         Crossing:               Appears normal  SVC:                   Appears normal  Abdomen  Ventral Wall:          Appears normal         Lt Kidney:              Appears normal  Cord Insertion:        Appears normal         Rt Kidney:              Appears normal  Situs:                 Appears normal         Bladder:                Appears normal  Stomach:               Appears normal  Extremities  Lt Humerus:            Appears normal         Lt Femur:               Appears normal  Rt Humerus:            Appears normal         Rt Femur:               Appears normal  Lt Forearm:            Appears normal         Lt Lower Leg:           Appears normal  Rt Forearm:            Appears normal         Rt Lower Leg:           Appears normal  Lt Hand:               Open hand nml          Lt  Foot:  Nml heel/foot  Rt Hand:               Open hand nml          Rt Foot:                Nml heel/foot  Other  Umbilical Cord:        Single UA              Genitalia:              Female-nml  Comment:     Technically difficult due to fetal position. ---------------------------------------------------------------------- Cervix Uterus Adnexa  Cervix  Length:            3.4  cm.  Normal appearance by transabdominal scan  Uterus  No abnormality visualized.  Right Ovary  Size(cm)     4.11   x   2.57   x  1.59      Vol(ml): 8.79  Within normal limits.  Left Ovary  Size(cm)     4.01   x   2.21   x  1.61      Vol(ml): 7.47  Within normal limits.  Cul De Sac  No free fluid seen.  Adnexa  No adnexal mass visualized ---------------------------------------------------------------------- Comments  This patient was seen for a detailed fetal anatomy scan.  She denies any significant past medical history and denies  any problems in her current pregnancy.  She had a cell free DNA test earlier in her pregnancy which  indicated a low risk for trisomy 77, 74, and 13. A female fetus  is predicted.  She was informed that the fetal growth and amniotic fluid  level were appropriate for her gestational age.  A two-vessel umbilical cord was noted on today's exam.  The implications and management of a two-vessel umbilical  cord was discussed in detail with the patient today.  She was  advised regarding the small association of trisomy 63 with a  two-vessel cord. The patient was reassured that based on her  negative cell free DNA test and as there were no other  anomalies noted on today's exam, it is highly unlikely that her  baby will have trisomy 18.  Due to the small possibility of trisomy 18, the patient was  offered and declined an amniocentesis today for definitive  diagnosis of fetal aneuploidy. The small risk of fetal growth  issues later in her pregnancy due to the two-vessel cord was  also discussed today.  The  patient was reassured today that the two-vessel cord is  most likely a normal variant and that her baby will most likely  not be affected by this finding after delivery.  The patient was informed that anomalies may be missed due  to technical limitations. If the fetus is in a suboptimal position  or maternal habitus is increased, visualization of the fetus in  the maternal uterus may be impaired.  An intrauterine synechiae was also noted on today's exam.  The patient was reassured that her baby will most likely not  be affected by the intrauterine synechiae.  Due to the two-vessel umbilical cord and intrauterine  synechiae, we will continue to follow her with growth  ultrasounds throughout her pregnancy.  A follow-up exam was scheduled in 5 weeks. 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 Initially spontaneously converted to 120-130's but then reverted to 220's again Discussed with Dr. Parke Poisson from MFM who recommends overnight admission to assess burden prior to considering initiation of treatment Growth scan obtained, prelim read without any evidence of cardiac abnormalities or hydrops, 1:1 conduction seen Admit to Antenatal Continuous EFM  Discussed with Dr. Para March who will assume care of the patient  #R sided abdominal pain UA  without any RBCs. Prelim on renal US w moderate hydrno, not unexpected at this stage of pregnancy, overall low suspicion for nephrolithiasis. Cervix closed on exam, low suspicion for preterm labor. Description somewhat more c/w round ligament pain and advancing gestational age. Trial tylenol and flexeril, ctm.    Venora Maples, MD/MPH Attending Family Medicine Physician, [facility] for [facility]

## 2023-04-26 NOTE — Progress Notes (Signed)
Discussed with Dr. Parke Poisson plan of care. He will also see the patient in the AM.  Plan is for overnight monitoring to see how often the baby breaks out of SVT.  No evidence of hydrops by [place].  May need Digoxin but will evaluate in the morning.    Milas Hock, MD Attending Obstetrician & Gynecologist, [facility] for [facility], [facility]

## 2023-04-26 NOTE — MAU Note (Signed)
..  Amy Bond is a 22 y.o. at [redacted]w[redacted]d here in MAU reporting: around 1500 she noticed she's been having some tightness and pain in her lower abdomen. Denies VB or LOF. +FM.  Pain score: 2 Vitals:   04/26/23 1651  BP: 132/82  Pulse: 75  Resp: 14  Temp: 98.2 F (36.8 C)  SpO2: 100%     FHT:230

## 2023-04-27 ENCOUNTER — Telehealth: Payer: Self-pay

## 2023-04-27 ENCOUNTER — Encounter: Payer: Medicaid Other | Admitting: Obstetrics & Gynecology

## 2023-04-27 DIAGNOSIS — Z3A23 23 weeks gestation of pregnancy: Secondary | ICD-10-CM | POA: Diagnosis not present

## 2023-04-27 DIAGNOSIS — O368390 Maternal care for abnormalities of the fetal heart rate or rhythm, unspecified trimester, not applicable or unspecified: Secondary | ICD-10-CM | POA: Diagnosis not present

## 2023-04-27 DIAGNOSIS — O368320 Maternal care for abnormalities of the fetal heart rate or rhythm, second trimester, not applicable or unspecified: Secondary | ICD-10-CM | POA: Diagnosis not present

## 2023-04-27 LAB — CULTURE, OB URINE: Culture: NO GROWTH

## 2023-04-27 LAB — GC/CHLAMYDIA PROBE AMP (~~LOC~~) NOT AT ARMC
Chlamydia: NEGATIVE
Comment: NEGATIVE
Comment: NORMAL
Neisseria Gonorrhea: NEGATIVE

## 2023-04-27 LAB — VITAMIN B12: Vitamin B-12: 198 pg/mL (ref 180–914)

## 2023-04-27 LAB — FOLATE: Folate: 14 ng/mL (ref >5.9)

## 2023-04-27 LAB — FERRITIN: Ferritin: 9 ng/mL — ABNORMAL LOW (ref 11–307)

## 2023-04-27 LAB — TSH: TSH: 3.638 u[IU]/mL (ref 0.350–4.500)

## 2023-04-27 MED ORDER — ASPIRIN 81 MG PO TBEC
81.0000 mg | DELAYED_RELEASE_TABLET | Freq: Every day | ORAL | Status: DC
  Administered 2023-04-27: 81 mg via ORAL
  Filled 2023-04-27: qty 1

## 2023-04-27 MED ORDER — SCOPOLAMINE 1 MG/3DAYS TD PT72: 1.0000 | MEDICATED_PATCH | TRANSDERMAL | Status: DC | PRN

## 2023-04-27 NOTE — Telephone Encounter (Signed)
Left message for patient to go to [facility] on Thursday 04/29/23 for Fetal Echo arrive before 8am and come to Maternal Fetal  Care on Monday 05/03/23 @ 10:15am - if this changes we will contact her.

## 2023-04-27 NOTE — Consult Note (Signed)
MFM Note  Amy Bond is currently at 23 weeks and 1 day.  She was admitted overnight to be placed on continuous monitoring due to fetal tachycardia that was noted when she presented to the MAU for lower abdominal pain.  The fetal heart rate tracing remained tachycardic in the 220s to 230s range for about 6 hours last evening.    At around 12:10 AM this morning, the fetal heart rate spontaneously reverted back to the normal range to the 140s to 150s range.  The fetal heart rate has remained in this range for about the past 15 hours other than a brief episode of tachycardia lasting about 8 minutes at around 5 AM this morning.  An ultrasound performed last evening showed vigorous fetal movements without any signs of hydrops.  The patient reports feeling fetal movements throughout the day.  I discussed her case with Dr. Casilda Carls with [facility] pediatric cardiology.    As the fetal tachycardia has not persisted for more than 50% of the time that she has been on the fetal heart rate monitor and as the fetal tachycardia has resolved, she does not need treatment with medication at this time.  The patient is scheduled for a fetal echocardiogram with Dr. Casilda Carls in 2 days on April 29, 2023 at 8 AM.  She will make further recommendations when she sees the patient.  Due to the fetal tachycardia, we will continue to follow the patient with weekly ultrasound exams for fetal assessment and to assess for signs of hydrops.  She is already scheduled for another ultrasound exam in our office next Monday, May 03, 2023.  The patient may be discharged home this afternoon.    Fetal kick count instructions were reviewed.    The patient and her partner stated that all of their questions were answered.

## 2023-04-27 NOTE — Progress Notes (Signed)
   04/27/23 1636  Departure Condition  Departure Condition Good  Mobility at [facility]  Patient/Caregiver Teaching Teach Back Method Used;Discharge instructions reviewed;Prescriptions reviewed;Follow-up care reviewed;Pain management discussed;Medications discussed;Patient/caregiver verbalized understanding  Departure Mode With significant other  Was procedural sedation performed on this patient during this visit? No   Patient alert and oriented x4, VS and pain stable.

## 2023-04-27 NOTE — Discharge Summary (Signed)
Physician Discharge Summary  Patient ID: Amy Bond MRN: [id number] DOB/AGE: 2000-10-30 22 y.o.  Admit date: 04/26/2023 Discharge date: 04/27/2023  Admission Diagnoses:  Discharge Diagnoses:  Principal Problem:   Fetal tachycardia affecting management of mother Active Problems:   Newborn with fetal tachycardia prior to birth   Discharged Condition: {condition:18240}  Hospital Course: ***  Consults: {consultation:18241}  Significant Diagnostic Studies: {diagnostics:18242}  Treatments: {Tx:18249}  Discharge Exam: Blood pressure 131/67, pulse 84, temperature 97.6 F (36.4 C), temperature source Oral, resp. rate 19, height 5\' 5"  (1.651 m), weight 57.6 kg, last menstrual period 11/07/2022, SpO2 100%, unknown if currently breastfeeding. {physical GUYQ:0347425}  Disposition: Discharge disposition: 01-Home or Self Care       Discharge Instructions     Discharge patient   Complete by: As directed    Discharge disposition: 01-Home or Self Care   Discharge patient date: 04/27/2023      Allergies as of 04/27/2023   No Known Allergies      Medication List     TAKE these medications    aspirin EC 81 MG tablet Take 1 tablet (81 mg total) by mouth at bedtime. Start taking when you are [redacted] weeks pregnant for rest of pregnancy for prevention of preeclampsia   Ferric Maltol 30 MG Caps Take 1 capsule (30 mg total) by mouth every other day. Please take one hour before breakfast or dinner   ondansetron 4 MG disintegrating tablet Commonly known as: ZOFRAN-ODT Take 1 tablet (4 mg total) by mouth every 6 (six) hours as needed for nausea.   prenatal multivitamin Tabs tablet Take 1 tablet by mouth daily at 12 noon.   scopolamine 1 MG/3DAYS Commonly known as: TRANSDERM-SCOP Place 1 patch (1.5 mg total) onto the skin every three (3) days as needed (nausea, vomiting). What changed:  when to take this reasons to take this        Follow-up Information      [facility] [employer] [facility] Go on 04/29/2023.   Specialty: Pediatric Cardiology Why: 8am for your fetal echo and appointment with the pediatric cardiologist Contact information: [address] [place] [place] [address] [phone]         [facility] for Maternal F[facility] at [facility]. Go on 04/30/2023.   Specialty: Maternal and Fetal Medicine Why: the maternal fetal medicine doctors will contact you to set this up Contact information: [address] [place] [address] [phone]                Signed: Steele Bing 04/27/2023, 3:05 PM

## 2023-04-27 NOTE — Plan of Care (Signed)
  Problem: Education: Goal: Knowledge of disease or condition will improve Outcome: Adequate for Discharge Goal: Knowledge of the prescribed therapeutic regimen will improve Outcome: Adequate for Discharge Goal: Individualized Educational Video(s) Outcome: Adequate for Discharge   Problem: Clinical Measurements: Goal: Complications related to the disease process, condition or treatment will be avoided or minimized Outcome: Adequate for Discharge   Problem: Education: Goal: Knowledge of General Education information will improve Description: Including pain rating scale, medication(s)/side effects and non-pharmacologic comfort measures Outcome: Adequate for Discharge   Problem: Health Behavior/Discharge Planning: Goal: Ability to manage health-related needs will improve Outcome: Adequate for Discharge   Problem: Clinical Measurements: Goal: Ability to maintain clinical measurements within normal limits will improve Outcome: Adequate for Discharge Goal: Will remain free from infection Outcome: Adequate for Discharge Goal: Diagnostic test results will improve Outcome: Adequate for Discharge Goal: Respiratory complications will improve Outcome: Adequate for Discharge Goal: Cardiovascular complication will be avoided Outcome: Adequate for Discharge   Problem: Activity: Goal: Risk for activity intolerance will decrease Outcome: Adequate for Discharge   Problem: Nutrition: Goal: Adequate nutrition will be maintained Outcome: Adequate for Discharge   Problem: Coping: Goal: Level of anxiety will decrease Outcome: Adequate for Discharge   Problem: Elimination: Goal: Will not experience complications related to bowel motility Outcome: Adequate for Discharge Goal: Will not experience complications related to urinary retention Outcome: Adequate for Discharge   Problem: Pain Management: Goal: General experience of comfort will improve Outcome: Adequate for Discharge   Problem:  Safety: Goal: Ability to remain free from injury will improve Outcome: Adequate for Discharge   Problem: Skin Integrity: Goal: Risk for impaired skin integrity will decrease Outcome: Adequate for Discharge

## 2023-04-27 NOTE — Progress Notes (Signed)
FACULTY PRACTICE ANTEPARTUM COMPREHENSIVE PROGRESS NOTE  Amy Bond is a 22 y.o. G2P0010 at 105w1d who is admitted for fetal tachycardia.  Estimated Date of Delivery: 08/23/23 Fetal presentation is unsure.  Length of Stay:  0 Days. Admitted 04/26/2023  Subjective: No complaints.  Patient reports good fetal movement.  She reports no uterine contractions, no bleeding and no loss of fluid per vagina.  Vitals:  Blood pressure 116/61, pulse 63, temperature 98.1 F (36.7 C), temperature source Oral, resp. rate 18, height 5\' 5"  (1.651 m), weight 57.6 kg, last menstrual period 11/07/2022, SpO2 100%, unknown if currently breastfeeding. Physical Examination: CONSTITUTIONAL: Well-developed, well-nourished female in no acute distress.  NEUROLOGIC: Alert and oriented to person, place, and time. No cranial nerve deficit noted. PSYCHIATRIC: Normal mood and affect. Normal behavior. Normal judgment and thought content. CARDIOVASCULAR: Normal heart rate noted, regular rhythm RESPIRATORY: Effort and breath sounds normal, no problems with respiration noted MUSCULOSKELETAL: Normal range of motion. No edema and no tenderness. 2+ distal pulses. ABDOMEN: Soft, nontender, nondistended, gravid. CERVIX: Dilation: Closed Exam by:: Dr. Crissie Reese  Fetal monitoring: FHR: 150 bpm, Variability: moderate, Accelerations: Present (10x10), Decelerations: Absent. Maintained HR of about 220-230 until around midnight and largely since that time baseline has been normal (every couple hours will return to fetal tachycardia) Uterine activity: no contractions per hour  Results for orders placed or performed during the hospital encounter of 04/26/23 (from the past 48 hour(s))  Urinalysis, Routine w reflex microscopic -Urine, Clean Catch     Status: Abnormal   Collection Time: 04/26/23  4:40 PM  Result Value Ref Range   Color, Urine YELLOW YELLOW   APPearance HAZY (A) CLEAR   Specific Gravity, Urine 1.013 1.005 - 1.030   pH  6.0 5.0 - 8.0   Glucose, UA NEGATIVE NEGATIVE mg/dL   Hgb urine dipstick NEGATIVE NEGATIVE   Bilirubin Urine NEGATIVE NEGATIVE   Ketones, ur 20 (A) NEGATIVE mg/dL   Protein, ur NEGATIVE NEGATIVE mg/dL   Nitrite NEGATIVE NEGATIVE   Leukocytes,Ua MODERATE (A) NEGATIVE   RBC / HPF 0-5 0 - 5 RBC/hpf   WBC, UA 6-10 0 - 5 WBC/hpf   Bacteria, UA RARE (A) NONE SEEN   Squamous Epithelial / HPF 11-20 0 - 5 /HPF   Mucus PRESENT     Comment: Performed at [facility], [address], [place], [place] [address]  Type and screen [facility]     Status: None   Collection Time: 04/26/23  5:05 PM  Result Value Ref Range   ABO/RH(D) O NEG    Antibody Screen NEG    Sample Expiration      04/29/2023,2359 Performed at [facility], [address], [place], [place] [address]   CBC     Status: Abnormal   Collection Time: 04/26/23  5:06 PM  Result Value Ref Range   WBC 7.4 4.0 - 10.5 K/uL   RBC 4.33 3.87 - 5.11 MIL/uL   Hemoglobin 9.7 (L) 12.0 - 15.0 g/dL   HCT 82.9 (L) 56.2 - 13.0 %   MCV 72.7 (L) 80.0 - 100.0 fL   MCH 22.4 (L) 26.0 - 34.0 pg   MCHC 30.8 30.0 - 36.0 g/dL   RDW 86.5 78.4 - 69.6 %   Platelets 242 150 - 400 K/uL   nRBC 0.0 0.0 - 0.2 %    Comment: Performed at [facility], [address] [place], [place] [address]  Comprehensive metabolic panel  Status: Abnormal   Collection Time: 04/26/23  5:06 PM  Result Value Ref Range   Sodium 136 135 - 145 mmol/L   Potassium 3.4 (L) 3.5 - 5.1 mmol/L   Chloride 106 98 - 111 mmol/L   CO2 21 (L) 22 - 32 mmol/L   Glucose, Bld 80 70 - 99 mg/dL    Comment: Glucose reference range applies only to samples taken after fasting for at least 8 hours.   BUN <5 (L) 6 - 20 mg/dL   Creatinine, Ser 7.82 0.44 - 1.00 mg/dL   Calcium 9.2 8.9 - 95.6 mg/dL   Total Protein 7.3 6.5 - 8.1 g/dL   Albumin 3.4 (L) 3.5 - 5.0 g/dL   AST 23 15 - 41 U/L   ALT 10 0 - 44 U/L   Alkaline Phosphatase 74 38 - 126 U/L   Total  Bilirubin 0.5 0.3 - 1.2 mg/dL   GFR, Estimated >21 >30 mL/min    Comment: (NOTE) Calculated using the CKD-EPI Creatinine Equation (2021)    Anion gap 9 5 - 15    Comment: Performed at [facility], [address], [place], [place] [address]  Wet prep, genital     Status: Abnormal   Collection Time: 04/26/23  5:21 PM   Specimen: Vaginal  Result Value Ref Range   Yeast Wet Prep HPF POC NONE SEEN NONE SEEN   Trich, Wet Prep NONE SEEN NONE SEEN   Clue Cells Wet Prep HPF POC NONE SEEN NONE SEEN   WBC, Wet Prep HPF POC >=10 (A) <10   Sperm NONE SEEN     Comment: Performed at [facility], [address], [place], [place] [address]    US Renal  Result Date: 04/26/2023 CLINICAL DATA:  Right flank pain, 23 weeks 0 day pregnant EXAM: RENAL / URINARY TRACT ULTRASOUND COMPLETE COMPARISON:  None Available. FINDINGS: Right Kidney: Renal measurements: 10.9 x 5.9 x 5.9 cm = volume: 199 mL. Echogenicity within normal limits. No mass visualized. Moderate right hydronephrosis. Left Kidney: Renal measurements: 9.8 x 5.1 x 4.3 cm = volume: 110 mL. Echogenicity within normal limits. No mass or hydronephrosis visualized. Bladder: Appears normal for degree of bladder distention. Other: None. IMPRESSION: Moderate right hydronephrosis. Electronically Signed   By: Minerva Fester M.D.   On: 04/26/2023 21:32    Current scheduled medications  aspirin EC  81 mg Oral QHS   docusate sodium  100 mg Oral Daily   influenza vac split trivalent PF  0.5 mL Intramuscular Tomorrow-1000   prenatal multivitamin  1 tablet Oral Q1200   sodium chloride flush  10 mL Intravenous Q12H    I have reviewed the patient's current medications.  ASSESSMENT: Active Problems:   Newborn with fetal tachycardia prior to birth   PLAN: - Fetal tachycardia improved. Will continue to monitor through the morning but suspect she will not need medication given current resolution.  - Will establish follow up plan with MFM to  determine outpatient management and monitoring.   Continue routine antenatal care.   Milas Hock, MD, FACOG Obstetrician & Gynecologist, [facility] for [facility], [facility]

## 2023-04-28 ENCOUNTER — Other Ambulatory Visit (HOSPITAL_COMMUNITY): Payer: Self-pay | Admitting: Obstetrics and Gynecology

## 2023-04-28 ENCOUNTER — Encounter: Payer: Self-pay | Admitting: Obstetrics and Gynecology

## 2023-04-28 ENCOUNTER — Telehealth: Payer: Self-pay | Admitting: Pharmacy Technician

## 2023-04-28 DIAGNOSIS — R790 Abnormal level of blood mineral: Secondary | ICD-10-CM | POA: Insufficient documentation

## 2023-04-28 NOTE — Telephone Encounter (Signed)
Auth Submission: NO AUTH NEEDED Site of care: Site of care: [facility] Payer: [place] med[employer] Medication & CPT/J Code(s) submitted: Venofer (Iron Sucrose) J1756 Route of submission (phone, fax, portal):  Phone # Fax # Auth type: Buy/Bill PB Units/visits requested:  Reference number:  Approval from: 04/28/23 to 06/29/23

## 2023-05-03 ENCOUNTER — Ambulatory Visit: Payer: Medicaid Other | Attending: Obstetrics

## 2023-05-03 ENCOUNTER — Other Ambulatory Visit: Payer: Self-pay

## 2023-05-03 ENCOUNTER — Other Ambulatory Visit: Payer: Self-pay | Admitting: *Deleted

## 2023-05-03 DIAGNOSIS — Z3A24 24 weeks gestation of pregnancy: Secondary | ICD-10-CM

## 2023-05-03 DIAGNOSIS — O368320 Maternal care for abnormalities of the fetal heart rate or rhythm, second trimester, not applicable or unspecified: Secondary | ICD-10-CM

## 2023-05-03 DIAGNOSIS — O368390 Maternal care for abnormalities of the fetal heart rate or rhythm, unspecified trimester, not applicable or unspecified: Secondary | ICD-10-CM

## 2023-05-03 DIAGNOSIS — O360120 Maternal care for anti-D [Rh] antibodies, second trimester, not applicable or unspecified: Secondary | ICD-10-CM

## 2023-05-03 DIAGNOSIS — O99012 Anemia complicating pregnancy, second trimester: Secondary | ICD-10-CM

## 2023-05-03 DIAGNOSIS — O09899 Supervision of other high risk pregnancies, unspecified trimester: Secondary | ICD-10-CM | POA: Insufficient documentation

## 2023-05-03 DIAGNOSIS — O358XX0 Maternal care for other (suspected) fetal abnormality and damage, not applicable or unspecified: Secondary | ICD-10-CM | POA: Diagnosis not present

## 2023-05-03 DIAGNOSIS — O0990 Supervision of high risk pregnancy, unspecified, unspecified trimester: Secondary | ICD-10-CM

## 2023-05-03 DIAGNOSIS — D563 Thalassemia minor: Secondary | ICD-10-CM

## 2023-05-10 ENCOUNTER — Ambulatory Visit: Payer: Medicaid Other

## 2023-05-10 VITALS — BP 111/74 | HR 78 | Temp 98.3°F | Resp 14 | Ht 66.0 in | Wt 128.2 lb

## 2023-05-10 DIAGNOSIS — D508 Other iron deficiency anemias: Secondary | ICD-10-CM

## 2023-05-10 DIAGNOSIS — Z3A25 25 weeks gestation of pregnancy: Secondary | ICD-10-CM | POA: Diagnosis not present

## 2023-05-10 DIAGNOSIS — O99012 Anemia complicating pregnancy, second trimester: Secondary | ICD-10-CM | POA: Diagnosis not present

## 2023-05-10 DIAGNOSIS — R790 Abnormal level of blood mineral: Secondary | ICD-10-CM

## 2023-05-10 MED ORDER — ACETAMINOPHEN 325 MG PO TABS
650.0000 mg | ORAL_TABLET | Freq: Once | ORAL | Status: AC
  Administered 2023-05-10: 650 mg via ORAL
  Filled 2023-05-10: qty 2

## 2023-05-10 MED ORDER — IRON SUCROSE 300 MG IVPB - SIMPLE MED
300.0000 mg | Freq: Once | Status: AC
  Administered 2023-05-10: 300 mg via INTRAVENOUS
  Filled 2023-05-10: qty 265

## 2023-05-10 MED ORDER — DIPHENHYDRAMINE HCL 25 MG PO CAPS
25.0000 mg | ORAL_CAPSULE | Freq: Once | ORAL | Status: AC
  Administered 2023-05-10: 25 mg via ORAL
  Filled 2023-05-10: qty 1

## 2023-05-10 NOTE — Progress Notes (Signed)
Diagnosis: Iron Deficiency Anemia  Provider:  Chilton Greathouse MD  Procedure: IV Infusion  IV Type: Peripheral, IV Location: L Antecubital  Venofer (Iron Sucrose), Dose: 300 mg  Infusion Start Time: 1408  Infusion Stop Time: 1548  Post Infusion IV Care: Observation period completed and Peripheral IV Discontinued  Discharge: Condition: Good, Destination: Home . AVS Declined  Performed by:  Rico Ala, LPN

## 2023-05-11 ENCOUNTER — Other Ambulatory Visit: Payer: Self-pay

## 2023-05-11 ENCOUNTER — Other Ambulatory Visit: Payer: Self-pay | Admitting: *Deleted

## 2023-05-11 ENCOUNTER — Ambulatory Visit: Payer: Medicaid Other | Attending: Obstetrics and Gynecology

## 2023-05-11 DIAGNOSIS — O09899 Supervision of other high risk pregnancies, unspecified trimester: Secondary | ICD-10-CM | POA: Insufficient documentation

## 2023-05-11 DIAGNOSIS — Z3689 Encounter for other specified antenatal screening: Secondary | ICD-10-CM

## 2023-05-11 DIAGNOSIS — O360120 Maternal care for anti-D [Rh] antibodies, second trimester, not applicable or unspecified: Secondary | ICD-10-CM | POA: Diagnosis not present

## 2023-05-11 DIAGNOSIS — O0990 Supervision of high risk pregnancy, unspecified, unspecified trimester: Secondary | ICD-10-CM | POA: Diagnosis present

## 2023-05-11 DIAGNOSIS — O99012 Anemia complicating pregnancy, second trimester: Secondary | ICD-10-CM | POA: Diagnosis not present

## 2023-05-11 DIAGNOSIS — Z3A25 25 weeks gestation of pregnancy: Secondary | ICD-10-CM

## 2023-05-11 DIAGNOSIS — D563 Thalassemia minor: Secondary | ICD-10-CM

## 2023-05-11 DIAGNOSIS — O358XX0 Maternal care for other (suspected) fetal abnormality and damage, not applicable or unspecified: Secondary | ICD-10-CM

## 2023-05-11 DIAGNOSIS — O368390 Maternal care for abnormalities of the fetal heart rate or rhythm, unspecified trimester, not applicable or unspecified: Secondary | ICD-10-CM | POA: Insufficient documentation

## 2023-05-17 ENCOUNTER — Ambulatory Visit: Payer: Medicaid Other

## 2023-05-17 VITALS — BP 112/75 | HR 72 | Temp 98.0°F | Resp 18 | Ht 66.0 in | Wt 130.2 lb

## 2023-05-17 DIAGNOSIS — R790 Abnormal level of blood mineral: Secondary | ICD-10-CM

## 2023-05-17 DIAGNOSIS — Z3A26 26 weeks gestation of pregnancy: Secondary | ICD-10-CM

## 2023-05-17 DIAGNOSIS — O99012 Anemia complicating pregnancy, second trimester: Secondary | ICD-10-CM | POA: Diagnosis not present

## 2023-05-17 DIAGNOSIS — D508 Other iron deficiency anemias: Secondary | ICD-10-CM | POA: Diagnosis not present

## 2023-05-17 MED ORDER — IRON SUCROSE 300 MG IVPB - SIMPLE MED
300.0000 mg | Freq: Once | Status: AC
  Administered 2023-05-17: 300 mg via INTRAVENOUS
  Filled 2023-05-17: qty 265

## 2023-05-17 MED ORDER — ACETAMINOPHEN 325 MG PO TABS
650.0000 mg | ORAL_TABLET | Freq: Once | ORAL | Status: AC
  Administered 2023-05-17: 650 mg via ORAL
  Filled 2023-05-17: qty 2

## 2023-05-17 MED ORDER — DIPHENHYDRAMINE HCL 25 MG PO CAPS
25.0000 mg | ORAL_CAPSULE | Freq: Once | ORAL | Status: AC
  Administered 2023-05-17: 25 mg via ORAL
  Filled 2023-05-17: qty 1

## 2023-05-17 NOTE — Progress Notes (Signed)
Diagnosis: Acute Anemia  Provider:  Chilton Greathouse MD  Procedure: IV Infusion  IV Type: Peripheral, IV Location: L Antecubital  Venofer (Iron Sucrose), Dose: 300 mg  Infusion Start Time: 1010  Infusion Stop Time: 1148  Post Infusion IV Care: Observation period completed and Peripheral IV Discontinued  Discharge: Condition: Stable, Destination: Home . AVS Declined  Performed by:  Wyvonne Lenz, RN

## 2023-05-18 ENCOUNTER — Other Ambulatory Visit: Payer: Medicaid Other

## 2023-05-18 ENCOUNTER — Ambulatory Visit (INDEPENDENT_AMBULATORY_CARE_PROVIDER_SITE_OTHER): Payer: Medicaid Other | Admitting: Obstetrics & Gynecology

## 2023-05-18 ENCOUNTER — Encounter: Payer: Self-pay | Admitting: Obstetrics and Gynecology

## 2023-05-18 ENCOUNTER — Encounter: Payer: Self-pay | Admitting: Obstetrics & Gynecology

## 2023-05-18 VITALS — BP 128/78 | HR 70 | Wt 129.6 lb

## 2023-05-18 DIAGNOSIS — O99012 Anemia complicating pregnancy, second trimester: Secondary | ICD-10-CM

## 2023-05-18 DIAGNOSIS — O09899 Supervision of other high risk pregnancies, unspecified trimester: Secondary | ICD-10-CM

## 2023-05-18 DIAGNOSIS — Z3480 Encounter for supervision of other normal pregnancy, unspecified trimester: Secondary | ICD-10-CM

## 2023-05-18 DIAGNOSIS — Z3A26 26 weeks gestation of pregnancy: Secondary | ICD-10-CM

## 2023-05-18 DIAGNOSIS — O26892 Other specified pregnancy related conditions, second trimester: Secondary | ICD-10-CM

## 2023-05-18 DIAGNOSIS — O0992 Supervision of high risk pregnancy, unspecified, second trimester: Secondary | ICD-10-CM

## 2023-05-18 DIAGNOSIS — Z6791 Unspecified blood type, Rh negative: Secondary | ICD-10-CM

## 2023-05-18 NOTE — Progress Notes (Signed)
PRENATAL VISIT NOTE  Subjective:  Amy Bond is a 22 y.o. G2P0010 at [redacted]w[redacted]d being seen today for ongoing prenatal care.  She is currently monitored for the following issues for this high-risk pregnancy and has Rh negative status during pregnancy; Supervision of high-risk pregnancy; Anemia of mother in pregnancy, antepartum, second trimester; Thalassemia alpha carrier; Intrauterine synechiae; Two vessel umbilical cord, antepartum; Fetal tachycardia affecting management of mother; and Low ferritin on their problem list.  Patient reports no complaints.  Contractions: Not present. Vag. Bleeding: None.  Movement: Present. Denies leaking of fluid.   The following portions of the patient's history were reviewed and updated as appropriate: allergies, current medications, past family history, past medical history, past social history, past surgical history and problem list.   Objective:   Vitals:   05/18/23 0855  BP: 128/78  Pulse: 70  Weight: 129 lb 9.6 oz (58.8 kg)    Fetal Status: Fetal Heart Rate (bpm): 133 Fundal Height: 26 cm Movement: Present     General:  Alert, oriented and cooperative. Patient is in no acute distress.  Skin: Skin is warm and dry. No rash noted.   Cardiovascular: Normal heart rate noted  Respiratory: Normal respiratory effort, no problems with respiration noted  Abdomen: Soft, gravid, appropriate for gestational age.  Pain/Pressure: Absent     Pelvic: Cervical exam deferred        Extremities: Normal range of motion.  Edema: None  Mental Status: Normal mood and affect. Normal behavior. Normal judgment and thought content.   [place] MFM OB LIMITED  Result Date: 05/11/2023 ----------------------------------------------------------------------  OBSTETRICS REPORT                       (Signed Final 05/11/2023 03:49 pm) ---------------------------------------------------------------------- Patient Info  ID #:       [id number]                          D.O.B.:  2000-08-28  (22 yrs)  Name:       Amy Bond [facility]                 Visit Date: 05/11/2023 03:34 pm ---------------------------------------------------------------------- Performed By  Attending:        Braxton Feathers DO       Ref. Address:     [address]                                                             [address]  Performed By:     Octaviano Batty BS       Secondary Phy.:   [facility] MAU/Triage                    RDMS  Referred By:      [facility]       Location:         [facility]                                                             [facility]                                                             [facility] ---------------------------------------------------------------------- Orders  #  Description                           Code        Ordered By  1  [place] MFM OB LIMITED                     76815.01    RAVI [facility] ----------------------------------------------------------------------  #  Order #                     Accession #                Episode #  1  [id number]                   [id number]                 [id number] ---------------------------------------------------------------------- Indications  Maternal care for fetal tachycardia during     O36.8390  pregnancy  2 vessel umbilical cord                        O69.89X0  Rh negative state in antepartum                O36.0190  Anemia during pregnancy in second trimester    O99.012  Genetic carrier (Alpha Thal CARRIER)           Z14.8  [redacted] weeks gestation of pregnancy                Z3A.25  LR NIPS - Female, declined AFP ---------------------------------------------------------------------- Fetal Evaluation  Num Of Fetuses:         1  Fetal Heart Rate(bpm):  138  Cardiac Activity:       Observed  Presentation:           Breech  Placenta:               Right lateral  Amniotic Fluid  AFI FV:      Within normal limits                              Largest Pocket(cm)                               5.98 ---------------------------------------------------------------------- Biometry  LV:        4.5  mm ---------------------------------------------------------------------- OB History  Gravidity:    2         Term:   0        Prem:   0  SAB:   1  TOP:          0       Ectopic:  0        Living: 0 ---------------------------------------------------------------------- Gestational Age  LMP:           26w 3d        Date:  11/07/22                  EDD:   08/14/23  Best:          25w 1d     Det. By:  U/S C R L  (01/05/23)    EDD:   08/23/23 ---------------------------------------------------------------------- Anatomy  Cranium:               Previously seen        Aortic Arch:            Previously seen  Cavum:                 Previously seen        Ductal Arch:            Previously seen  Ventricles:            Appears normal         Diaphragm:              Appears normal  Choroid Plexus:        Previously seen        Stomach:                Appears normal, left                                                                        sided  Cerebellum:            Previously seen        Abdomen:                Previously seen  Posterior Fossa:       Previously seen        Abdominal Wall:         Previously seen  Face:                  Orbits and profile     Cord Vessels:           Previously seen                         previously seen  Lips:                  Previously seen        Kidneys:                Appear normal  Thoracic:              Previously seen        Bladder:                Appears normal  Heart:                 Known SVT  Spine:                  Previously seen  RVOT:                  Previously seen        Upper Extremities:      Previously seen  LVOT:                  Previously seen        Lower Extremities:      Previously seen ---------------------------------------------------------------------- Cervix Uterus Adnexa  Cervix  Not visualized (advanced GA >24wks)  Uterus   No abnormality visualized.  Right Ovary  Not visualized.  Left Ovary  Not visualized.  Adnexa  No abnormality visualized ---------------------------------------------------------------------- Comments  The patient is here for ultrasound at 25w 1d. EDD:  08/23/2023 dated by U/S C R L  (01/05/23). She is here for  fetal tachycardia which has resolved.  Sonographic findings  Single intrauterine pregnancy.  Fetal cardiac activity:  Observed and appears normal.  Presentation: Breech.  Interval fetal anatomy appears normal.  Amniotic fluid volume: Within normal limits. MVP: 5.98 cm.  Placenta: Right lateral.  Recommendations  - Serial growth ultrasounds every 4-6 weeks until delivery  - Antenatal testing around 36 weeks ----------------------------------------------------------------------                   Braxton Feathers, DO Electronically Signed Final Report   05/11/2023 03:49 pm ----------------------------------------------------------------------   [place] MFM OB FOLLOW UP  Result Date: 05/03/2023 ----------------------------------------------------------------------  OBSTETRICS REPORT                       (Signed Final 05/03/2023 11:33 am) ---------------------------------------------------------------------- Patient Info  ID #:       [id number]                          D.O.B.:  2001-01-24 (22 yrs)  Name:       MARYL BRAMLET [facility]                 Visit Date: 05/03/2023 10:46 am ---------------------------------------------------------------------- Performed By  Attending:        Noralee Space MD        Ref. Address:     [address]                                                             [address]  Performed By:     Emeline Darling BS,      Secondary Phy.:   [facility] MAU/Triage                    RDMS  Referred By:      [facility]       Location:         [facility] ---------------------------------------------------------------------- Orders  #  Description                           Code        Ordered By  1  [place] MFM OB FOLLOW UP                   [phone]    Rosana Hoes ----------------------------------------------------------------------  #  Order #                     Accession #                Episode #  1  [id number]                   [id number]                 [id number] ---------------------------------------------------------------------- Indications  Maternal care for fetal tachycardia during     O36.8390  pregnancy  2 vessel umbilical cord                        O69.89X0  Rh negative state in antepartum                O36.0190  Anemia during pregnancy in second trimester    O99.012  Genetic carrier (Alpha Thal CARRIER)           Z14.8  LR NIPS - Female, declined AFP  [redacted] weeks gestation of pregnancy                Z3A.24 ---------------------------------------------------------------------- Fetal Evaluation  Num Of Fetuses:         1  Fetal Heart Rate(bpm):  141  Cardiac Activity:       Observed  Presentation:           Breech  Placenta:               Right lateral  P. Cord Insertion:      Previously seen  Amniotic Fluid  AFI FV:      Within normal limits ---------------------------------------------------------------------- Biometry  BPD:        58  mm     G. Age:  23w 5d         35  %    CI:        70.84   %    70 - 86                                                          FL/HC:      20.4   %    18.7 - 20.9  HC:      219.6  mm     G. Age:  24w 0d         32  %    HC/AC:  1.06        1.05 - 1.21  AC:       208   mm     G. Age:  25w 3d         82  %    FL/BPD:     77.1   %    71 - 87  FL:       44.7  mm     G. Age:  24w 5d         62  %    FL/AC:      21.5   %    20 - 24  Est. FW:     749  gm    1 lb 10 oz      82  % ---------------------------------------------------------------------- OB History   Gravidity:    2         Term:   0        Prem:   0        SAB:   1  TOP:          0       Ectopic:  0        Living: 0 ---------------------------------------------------------------------- Gestational Age  LMP:           25w 2d        Date:  11/07/22                  EDD:   08/14/23  U/S Today:     24w 3d                                        EDD:   08/20/23  Best:          24w 0d     Det. By:  U/S C R L  (01/05/23)    EDD:   08/23/23 ---------------------------------------------------------------------- Targeted Anatomy  Central Nervous System  Calvarium/Cranial V.:  Appears normal         Cereb./Vermis:          Previously seen  Cavum:                 Previously seen        Sales executive:         Previously seen  Lateral Ventricles:    Appears normal         Midline Falx:           Previously seen  Choroid Plexus:        Previously seen  Spine  Cervical:              Previously seen        Sacral:                 Not well visualized  Thoracic:              Previously seen        Shape/Curvature:        Previously seen  Lumbar:                Not well visualized  Head/Neck  Lips:                  Previously seen        Profile:  Previously seen  Neck:                  Previously seen        Orbits/Eyes:            Previously seen  Nuchal Fold:           Previously seen        Mandible:               Previously seen  Nasal Bone:            Previously seen        Maxilla:                Previously seen  Palate:                Previously seen  Thorax  4 Chamber View:        Appears normal         SVC:                    Appears normal  Cardiac Activity:      Observed               Interventr. Septum:     Previously seen  Cardiac Rhythm:        Normal                 Cardiac Axis:           Previously een  Cardiac Situs:         Appears normal         Diaphragm:              Appears normal  Rt Outflow Tract:      Previously seen        3 Vessel View:          Previously seen  Lt Outflow Tract:      Appears  normal         3 V Trachea View:       Previously seen  Aortic Arch:           Previously seen        IVC:                    Appears normal  Ductal Arch:           Previously seen        Crossing:               Previously seen  Abdomen  Ventral Wall:          Previously seen        Lt Kidney:              Appears normal  Cord Insertion:        Previously seen        Rt Kidney:              Appears normal  Situs:                 Appears normal         Bladder:                Appears normal  Stomach:               Appears normal  Extremities  Lt Humerus:  Previously seen        Lt Femur:               Previously seen  Rt Humerus:            Previously seen        Rt Femur:               Previously seen  Lt Forearm:            Previously seen        Lt Lower Leg:           Previously seen  Rt Forearm:            Previously seen        Rt Lower Leg:           Previously seen  Lt Hand:               Previously seen        Lt Foot:                Previously seen  Rt Hand:               Previously seen        Rt Foot:                Previously seen  Other  Umbilical Cord:        Single UA prev         Genitalia:              Female-nml  Comment:     Technically difficult due to fetal position. ---------------------------------------------------------------------- Cervix Uterus Adnexa  Cervix  Length:            3.3  cm.  Not visualized (advanced GA >24wks) ---------------------------------------------------------------------- Impression  Patient was evaluated last week for fetal tachycardia.  Subsequently, she had fetal echocardiography that ruled out  structural heart malformations.  Fetal heart rate rhythm  spontaneously restored to normal.  Single umbilical artery.  On today's ultrasound, amniotic fluid is normal and good fetal  activity seen.  Fetal heart rate and rhythm appear normal.  Fetal growth is appropriate for gestational age.  Minimal physiological pericardial effusion is seen.  I explained the  findings and reassured the couple. ---------------------------------------------------------------------- Recommendations  -Follow-up ultrasound to assess fetal heart rate and rhythm  next week.  -Fetal growth assessment in 4 weeks. ----------------------------------------------------------------------                 Noralee Space, MD Electronically Signed Final Report   05/03/2023 11:33 am ----------------------------------------------------------------------   [place] MFM OB Follow Up  Result Date: 04/27/2023 ----------------------------------------------------------------------  OBSTETRICS REPORT                       (Signed Final 04/27/2023 03:02 pm) ---------------------------------------------------------------------- Patient Info  ID #:       [id number]                          D.O.B.:  06/30/2000 (22 yrs)  Name:       ALVARETTA MCPARLAND [facility]                 Visit Date: 04/26/2023 06:16 pm ---------------------------------------------------------------------- Performed By  Attending:        Ma Rings MD         Ref. Address:     [address]  Performed By:     Percell Boston          Secondary Phy.:   [facility] MAU/Triage                    RDMS  Referred By:      [facility]       [facility] ---------------------------------------------------------------------- Orders  #  Description                           Code        Ordered By  1  [place] MFM OB FOLLOW UP                   [id number]    Merian Capron ----------------------------------------------------------------------  #  Order #                     Accession #                Episode #  1  [id number]                   [id number]                 [id number] ---------------------------------------------------------------------- Indications  Maternal care for fetal tachycardia during     O36.8390  pregnancy   [redacted] weeks gestation of pregnancy                Z3A.23  2 vessel umbilical cord                        O69.89X0  Rh negative state in antepartum                O36.0190  Anemia during pregnancy in second trimester    O99.012  Genetic carrier (Alpha Thal CARRIER)           Z14.8  LR NIPS - Female, declined AFP ---------------------------------------------------------------------- Fetal Evaluation  Num Of Fetuses:         1  Fetal Heart Rate(bpm):  219  Cardiac Activity:       Tachycardia  Amniotic Fluid  AFI FV:      Within normal limits                              Largest Pocket(cm)                              3.7 ---------------------------------------------------------------------- Biometry  BPD:      57.5  mm     G. Age:  23w 4d         68  %    CI:        79.66   %    70 - 86  FL/HC:      19.5   %    19.2 - 20.8  HC:      203.6  mm     G. Age:  22w 3d         17  %    HC/AC:      1.03        1.05 - 1.21  AC:       197   mm     G. Age:  24w 3d         82  %    FL/BPD:     69.2   %    71 - 87  FL:       39.8  mm     G. Age:  22w 6d         33  %    FL/AC:      20.2   %    20 - 24  Est. FW:     608  gm      1 lb 5 oz     71  % ---------------------------------------------------------------------- OB History  Gravidity:    2         Term:   0        Prem:   0        SAB:   1  TOP:          0       Ectopic:  0        Living: 0 ---------------------------------------------------------------------- Gestational Age  LMP:           24w 2d        Date:  11/07/22                  EDD:   08/14/23  U/S Today:     23w 2d                                        EDD:   08/21/23  Best:          23w 0d     Det. By:  U/S C R L  (01/05/23)    EDD:   08/23/23 ---------------------------------------------------------------------- Anatomy  Cranium:               Appears normal         LVOT:                   Appears normal  Ventricles:            Appears normal         Diaphragm:               Appears normal  Thoracic:              Appears normal         Stomach:                Appears normal, left  sided  Heart:                 Appears normal         Bladder:                Appears normal                         (4CH, axis, and                         situs)  RVOT:                  Appears normal ---------------------------------------------------------------------- Cervix Uterus Adnexa  Cervix  Length:            3.5  cm.  Normal appearance by transabdominal scan  Uterus  No abnormality visualized.  Right Ovary  Not visualized.  Left Ovary  Not visualized.  Cul De Sac  No free fluid seen.  Adnexa  No abnormality visualized ---------------------------------------------------------------------- Comments  Mariel Aloe is currently at 23 weeks and 0 days.  She  presented to the hospital today complaining of lower  abdominal cramping.  Fetal tachycardia with a heart rate in  the 220s to 230s was noted on fetal monitoring.  An ultrasound performed this evening did not show any signs  of fetal hydrops.  Fetal tachycardia with a fetal heart rate in  the 220s-230s was noted throughout the ultrasound exam.  Fetal movements including fetal breathing movements were  noted throughout the ultrasound exam.  The overall EFW measured today was 1 pound 5 ounces  (71st percentile).  During her fetal anatomy scan performed in our office last  month, there were no obvious fetal cardiac defects noted.  A  two-vessel umbilical cord was noted during that exam.  The fetal tachycardia resolved for a few minutes while the  patient was on continuous monitoring in the MAU.  As the fetal status is currently reassuring and there are no  signs of hydrops, we will keep the patient on continuous  monitoring overnight to determine if the fetal tachycardia  remains continuous or is intermittent.  Should the fetal tachycardia remain constant and persists, we   will consult with pediatric cardiology tomorrow morning to  determine if treatment with maternal oral digoxin is indicated.  She should remain on continuous monitoring overnight. ----------------------------------------------------------------------                   Ma Rings, MD Electronically Signed Final Report   04/27/2023 03:02 pm ----------------------------------------------------------------------   US Renal  Result Date: 04/26/2023 CLINICAL DATA:  Right flank pain, 23 weeks 0 day pregnant EXAM: RENAL / URINARY TRACT ULTRASOUND COMPLETE COMPARISON:  None Available. FINDINGS: Right Kidney: Renal measurements: 10.9 x 5.9 x 5.9 cm = volume: 199 mL. Echogenicity within normal limits. No mass visualized. Moderate right hydronephrosis. Left Kidney: Renal measurements: 9.8 x 5.1 x 4.3 cm = volume: 110 mL. Echogenicity within normal limits. No mass or hydronephrosis visualized. Bladder: Appears normal for degree of bladder distention. Other: None. IMPRESSION: Moderate right hydronephrosis. Electronically Signed   By: Minerva Fester M.D.   On: 04/26/2023 21:32    Assessment and Plan:  Pregnancy: G2P0010 at [redacted]w[redacted]d 1. Two vessel umbilical cord, antepartum Follow up scans as per MFM and recommended antenatal testing.  2. Anemia of mother in pregnancy, antepartum, second trimester Getting IV iron infusions currently  3. Rh negative status  during pregnancy in second trimester Discussed Rhogam, will get next visit.  Antibody screen done today.  4. [redacted] weeks gestation of pregnancy 5. Supervision of high risk pregnancy, antepartum GTT and other labs today, will follow up results and manage accordingly. Information given about Tdap, will decide at next visit. Preterm labor symptoms and general obstetric precautions including but not limited to vaginal bleeding, contractions, leaking of fluid and fetal movement were reviewed in detail with the patient. Please refer to After Visit Summary for other  counseling recommendations.   No follow-ups on file.  Future Appointments  Date Time Provider Department Center  05/24/2023  9:45 AM [facility] 7 [facility] None  06/01/2023  3:50 PM Joselynne Killam, Jethro Bastos, MD [facility]  06/02/2023  3:15 PM [facility] NURSE [facility]  06/02/2023  3:30 PM [facility] US5 [facility]  06/15/2023  3:50 PM Phyllistine Domingos, Jethro Bastos, MD [facility]  06/29/2023  3:50 PM Jacqueli Pangallo, Jethro Bastos, MD [facility]  07/13/2023  3:30 PM Marena Witts, Jethro Bastos, MD [facility]  07/27/2023  3:30 PM Stratford Bing, MD [facility]    Jaynie Collins, MD

## 2023-05-18 NOTE — Patient Instructions (Signed)
Return to office for any scheduled appointments. Call the office or go to the MAU at [facility] if: You begin to have strong, frequent contractions Your water breaks.  Sometimes it is a big gush of fluid, sometimes it is just a trickle that keeps getting your underwear wet or running down your legs You have vaginal bleeding.  It is normal to have a small amount of spotting if your cervix was checked.  You do not feel your baby moving like normal.  If you do not, get something to eat and drink and lay down and focus on feeling your baby move.   If your baby is still not moving like normal, you should call the office or go to MAU. Any other obstetric concerns.   TDaP Vaccine Pregnancy Get the Whooping Cough Vaccine While You Are Pregnant (CDC)  It is important for women to get the whooping cough vaccine in the third trimester of each pregnancy. Vaccines are the best way to prevent this disease. There are 2 different whooping cough vaccines. Both vaccines combine protection against whooping cough, tetanus and diphtheria, but they are for different age groups: Tdap: for everyone 11 years or older, including pregnant women  DTaP: for children 2 months through 6 years of age  You need the whooping cough vaccine during each of your pregnancies The recommended time to get the shot is during your 27th through 36th week of pregnancy, preferably during the earlier part of this time period. The Centers for Disease Control and Prevention (CDC) recommends that pregnant women receive the whooping cough vaccine for adolescents and adults (called Tdap vaccine) during the third trimester of each pregnancy. The recommended time to get the shot is during your 27th through 36th week of pregnancy, preferably during the earlier part of this time period. This replaces the original recommendation that pregnant women get the vaccine only if they had not previously received it. The American  College of Obstetricians and Gynecologists and the American College of Nurse-Midwives support this recommendation.  You should get the whooping cough vaccine while pregnant to pass protection to your baby frame support disabled and/or not supported in this browser  Learn why Laura decided to get the whooping cough vaccine in her 3rd trimester of pregnancy and how her baby girl was born with some protection against the disease. Also available on YouTube. After receiving the whooping cough vaccine, your body will create protective antibodies (proteins produced by the body to fight off diseases) and pass some of them to your baby before birth. These antibodies provide your baby some short-term protection against whooping cough in early life. These antibodies can also protect your baby from some of the more serious complications that come along with whooping cough. Your protective antibodies are at their highest about 2 weeks after getting the vaccine, but it takes time to pass them to your baby. So the preferred time to get the whooping cough vaccine is early in your third trimester. The amount of whooping cough antibodies in your body decreases over time. That is why CDC recommends you get a whooping cough vaccine during each pregnancy. Doing so allows each of your babies to get the greatest number of protective antibodies from you. This means each of your babies will get the best protection possible against this disease.  Getting the whooping cough vaccine while pregnant is better than getting the vaccine after you give birth Whooping cough vaccination during pregnancy is ideal so   your baby will have short-term protection as soon as he is born. This early protection is important because your baby will not start getting his whooping cough vaccines until he is 2 months old. These first few months of life are when your baby is at greatest risk for catching whooping cough. This is also when he's at greatest  risk for having severe, potentially life-threating complications from the infection. To avoid that gap in protection, it is best to get a whooping cough vaccine during pregnancy. You will then pass protection to your baby before he is born. To continue protecting your baby, he should get whooping cough vaccines starting at 2 months old. You may never have gotten the Tdap vaccine before and did not get it during this pregnancy. If so, you should make sure to get the vaccine immediately after you give birth, before leaving the hospital or birthing center. It will take about 2 weeks before your body develops protection (antibodies) in response to the vaccine. Once you have protection from the vaccine, you are less likely to give whooping cough to your newborn while caring for him. But remember, your baby will still be at risk for catching whooping cough from others. A recent study looked to see how effective Tdap was at preventing whooping cough in babies whose mothers got the vaccine while pregnant or in the hospital after giving birth. The study found that getting Tdap between 27 through 36 weeks of pregnancy is 85% more effective at preventing whooping cough in babies younger than 2 months old. Blood tests cannot tell if you need a whooping cough vaccine There are no blood tests that can tell you if you have enough antibodies in your body to protect yourself or your baby against whooping cough. Even if you have been sick with whooping cough in the past or previously received the vaccine, you still should get the vaccine during each pregnancy. Breastfeeding may pass some protective antibodies onto your baby By breastfeeding, you may pass some antibodies you have made in response to the vaccine to your baby. When you get a whooping cough vaccine during your pregnancy, you will have antibodies in your breast milk that you can share with your baby as soon as your milk comes in. However, your baby will not get  protective antibodies immediately if you wait to get the whooping cough vaccine until after delivering your baby. This is because it takes about 2 weeks for your body to create antibodies. Learn more about the health benefits of breastfeeding.  

## 2023-05-19 ENCOUNTER — Other Ambulatory Visit: Payer: Self-pay | Admitting: *Deleted

## 2023-05-19 ENCOUNTER — Encounter: Payer: Self-pay | Admitting: Obstetrics and Gynecology

## 2023-05-19 ENCOUNTER — Encounter (HOSPITAL_COMMUNITY): Payer: Self-pay | Admitting: Obstetrics and Gynecology

## 2023-05-19 ENCOUNTER — Encounter: Payer: Self-pay | Admitting: Obstetrics & Gynecology

## 2023-05-19 ENCOUNTER — Inpatient Hospital Stay (HOSPITAL_COMMUNITY)
Admission: AD | Admit: 2023-05-19 | Discharge: 2023-05-19 | Disposition: A | Discharge status: Home / Self Care | Payer: Medicaid Other | Attending: Obstetrics & Gynecology | Admitting: Obstetrics & Gynecology

## 2023-05-19 DIAGNOSIS — Z3A26 26 weeks gestation of pregnancy: Secondary | ICD-10-CM | POA: Insufficient documentation

## 2023-05-19 DIAGNOSIS — R030 Elevated blood-pressure reading, without diagnosis of hypertension: Secondary | ICD-10-CM | POA: Insufficient documentation

## 2023-05-19 DIAGNOSIS — O26892 Other specified pregnancy related conditions, second trimester: Secondary | ICD-10-CM | POA: Diagnosis not present

## 2023-05-19 DIAGNOSIS — R519 Headache, unspecified: Secondary | ICD-10-CM | POA: Insufficient documentation

## 2023-05-19 DIAGNOSIS — R7309 Other abnormal glucose: Secondary | ICD-10-CM | POA: Insufficient documentation

## 2023-05-19 DIAGNOSIS — O24410 Gestational diabetes mellitus in pregnancy, diet controlled: Secondary | ICD-10-CM

## 2023-05-19 LAB — COMPREHENSIVE METABOLIC PANEL
ALT: 11 U/L (ref 0–44)
AST: 17 U/L (ref 15–41)
Albumin: 3.2 g/dL — ABNORMAL LOW (ref 3.5–5.0)
Alkaline Phosphatase: 76 U/L (ref 38–126)
Anion gap: 8 (ref 5–15)
BUN: 7 mg/dL (ref 6–20)
CO2: 21 mmol/L — ABNORMAL LOW (ref 22–32)
Calcium: 9.2 mg/dL (ref 8.9–10.3)
Chloride: 106 mmol/L (ref 98–111)
Creatinine, Ser: 0.86 mg/dL (ref 0.44–1.00)
GFR, Estimated: >60 mL/min (ref >60)
Glucose, Bld: 89 mg/dL (ref 70–99)
Potassium: 3.8 mmol/L (ref 3.5–5.1)
Sodium: 135 mmol/L (ref 135–145)
Total Bilirubin: 0.7 mg/dL (ref <1.2)
Total Protein: 6.9 g/dL (ref 6.5–8.1)

## 2023-05-19 LAB — CBC
HCT: 30.9 % — ABNORMAL LOW (ref 36.0–46.0)
Hematocrit: 34.2 % (ref 34.0–46.6)
Hemoglobin: 10.5 g/dL — ABNORMAL LOW (ref 11.1–15.9)
Hemoglobin: 9.5 g/dL — ABNORMAL LOW (ref 12.0–15.0)
MCH: 23.1 pg — ABNORMAL LOW (ref 26.6–33.0)
MCH: 22.4 pg — ABNORMAL LOW (ref 26.0–34.0)
MCHC: 30.7 g/dL — ABNORMAL LOW (ref 31.5–35.7)
MCHC: 30.7 g/dL (ref 30.0–36.0)
MCV: 75 fL — ABNORMAL LOW (ref 79–97)
MCV: 72.7 fL — ABNORMAL LOW (ref 80.0–100.0)
Platelets: 302 10*3/uL (ref 150–450)
Platelets: 261 10*3/uL (ref 150–400)
RBC: 4.54 x10E6/uL (ref 3.77–5.28)
RBC: 4.25 MIL/uL (ref 3.87–5.11)
RDW: 15.2 % (ref 11.7–15.4)
RDW: 15.7 % — ABNORMAL HIGH (ref 11.5–15.5)
WBC: 6.7 10*3/uL (ref 3.4–10.8)
WBC: 7.3 10*3/uL (ref 4.0–10.5)
nRBC: 0 % (ref 0.0–0.2)

## 2023-05-19 LAB — RPR: RPR Ser Ql: NONREACTIVE

## 2023-05-19 LAB — HIV ANTIBODY (ROUTINE TESTING W REFLEX): HIV Screen 4th Generation wRfx: NONREACTIVE

## 2023-05-19 LAB — PROTEIN / CREATININE RATIO, URINE
Creatinine, Urine: 143 mg/dL
Protein Creatinine Ratio: 0.15 mg/mg{creat} (ref 0.00–0.15)
Total Protein, Urine: 22 mg/dL

## 2023-05-19 LAB — GLUCOSE TOLERANCE, 2 HOURS W/ 1HR
Glucose, 1 hour: 146 mg/dL (ref 70–179)
Glucose, 2 hour: 96 mg/dL (ref 70–152)
Glucose, Fasting: 126 mg/dL — ABNORMAL HIGH (ref 70–91)

## 2023-05-19 LAB — ANTIBODY SCREEN: Antibody Screen: NEGATIVE

## 2023-05-19 MED ORDER — NIFEDIPINE 10 MG PO CAPS: 10.0000 mg | ORAL_CAPSULE | ORAL | Status: DC | PRN

## 2023-05-19 MED ORDER — ACCU-CHEK SOFTCLIX LANCETS MISC: 1.0000 | Freq: Four times a day (QID) | 12 refills | Status: DC

## 2023-05-19 MED ORDER — LABETALOL HCL 5 MG/ML IV SOLN: 40.0000 mg | INTRAVENOUS | Status: DC | PRN

## 2023-05-19 MED ORDER — NIFEDIPINE 10 MG PO CAPS: 20.0000 mg | ORAL_CAPSULE | ORAL | Status: DC | PRN

## 2023-05-19 MED ORDER — ACCU-CHEK GUIDE W/DEVICE KIT: 1.0000 | PACK | Freq: Four times a day (QID) | 0 refills | Status: DC

## 2023-05-19 MED ORDER — GLUCOSE BLOOD VI STRP: ORAL_STRIP | 12 refills | Status: DC

## 2023-05-19 NOTE — Telephone Encounter (Signed)
Pt calling stating that her BP was 132/85  And is noticing spots in her vision.

## 2023-05-19 NOTE — MAU Provider Note (Signed)
History     CSN: [id number]  Arrival date and time: 05/19/23 1709   None     Chief Complaint  Patient presents with   Headache   Hypertension   Patient presents today with HA since yesterday. HA initially 7/10 took 1g Tylenol, improved to 2/10 now. Has had a nap. No epigastric pain, no swelling. Spots in vision at times, does not note a pattern to this. No spots in vision today. Endorses regular and vigorous fetal movement.  No VB or LOF.  Reports follow up at MFM and [facility] in early December.   Headache  Pertinent negatives include no abdominal pain, coughing, fever, nausea or vomiting. Her past medical history is significant for hypertension.  Hypertension Associated symptoms include headaches. Pertinent negatives include no chest pain, palpitations or shortness of breath.    OB History     Gravida  2   Para      Term      Preterm      AB  1   Living         SAB  1   IAB      Ectopic      Multiple      Live Births              Past Medical History:  Diagnosis Date   Anemia    Anxiety    UTI (urinary tract infection)     Past Surgical History:  Procedure Laterality Date   NO PAST SURGERIES      Family History  Problem Relation Age of Onset   Healthy Mother    Other Mother        2021- killed   Healthy Father     Social History   Tobacco Use   Smoking status: Never   Smokeless tobacco: Never   Tobacco comments:    Quit vaping May 2024  Vaping Use   Vaping status: Former   Substances: Nicotine, Flavoring  Substance Use Topics   Alcohol use: Never   Drug use: Never    Allergies: No Known Allergies  Medications Prior to Admission  Medication Sig Dispense Refill Last Dose   aspirin EC 81 MG tablet Take 1 tablet (81 mg total) by mouth at bedtime. Start taking when you are [redacted] weeks pregnant for rest of pregnancy for prevention of preeclampsia 300 tablet 2 05/18/2023   ondansetron (ZOFRAN-ODT) 4 MG disintegrating  tablet Take 1 tablet (4 mg total) by mouth every 6 (six) hours as needed for nausea. 20 tablet 2 05/19/2023   Accu-Chek Softclix Lancets lancets 1 each by Other route 4 (four) times daily. 100 each 12    Blood Glucose Monitoring Suppl (ACCU-CHEK GUIDE) w/Device KIT 1 Device by Does not apply route 4 (four) times daily. 1 kit 0    Ferric Maltol 30 MG CAPS Take 1 capsule (30 mg total) by mouth every other day. Please take one hour before breakfast or dinner      Ferrous Sulfate (IRON PO) Take 1 tablet by mouth daily. (Patient not taking: Reported on 05/18/2023)      glucose blood test strip Use as instructed 100 each 12    Prenatal Vit-Fe Fumarate-FA (PRENATAL MULTIVITAMIN) TABS tablet Take 1 tablet by mouth daily at 12 noon.      scopolamine (TRANSDERM-SCOP) 1 MG/3DAYS Place 1 patch (1.5 mg total) onto the skin every three (3) days as needed (nausea, vomiting). (Patient not taking: Reported on 05/18/2023)  Review of Systems  Constitutional:  Negative for chills, fatigue, fever and unexpected weight change.  Respiratory:  Negative for cough and shortness of breath.   Cardiovascular:  Negative for chest pain, palpitations and leg swelling.  Gastrointestinal:  Negative for abdominal pain, constipation, diarrhea, nausea and vomiting.  Genitourinary:  Negative for difficulty urinating, flank pain, frequency and urgency.  Neurological:  Positive for headaches.   Physical Exam   Blood pressure 125/80, pulse 93, temperature 98.3 F (36.8 C), temperature source Oral, resp. rate 16, height 5\' 6"  (1.676 m), weight 58.4 kg, last menstrual period 11/07/2022, SpO2 99%, unknown if currently breastfeeding.  Physical Exam Vitals reviewed.  Constitutional:      Appearance: Normal appearance.  HENT:     Head: Normocephalic.  Cardiovascular:     Rate and Rhythm: Normal rate.     Pulses: Normal pulses.  Pulmonary:     Effort: Pulmonary effort is normal.  Skin:    General: Skin is warm and dry.      Capillary Refill: Capillary refill takes less than 2 seconds.  Neurological:     Mental Status: She is alert and oriented to person, place, and time.  Psychiatric:        Mood and Affect: Mood normal.        Behavior: Behavior normal.        Thought Content: Thought content normal.        Judgment: Judgment normal.     Fetal Assessment 150 bpm, Mod Var, -Decels, +Accels Intermittent fetal tachycardia.  Toco: UI  MAU Course   Results for orders placed or performed during the hospital encounter of 05/19/23 (from the past 24 hour(s))  Comprehensive metabolic panel     Status: Abnormal   Collection Time: 05/19/23  5:28 PM  Result Value Ref Range   Sodium 135 135 - 145 mmol/L   Potassium 3.8 3.5 - 5.1 mmol/L   Chloride 106 98 - 111 mmol/L   CO2 21 (L) 22 - 32 mmol/L   Glucose, Bld 89 70 - 99 mg/dL   BUN 7 6 - 20 mg/dL   Creatinine, Ser 8.29 0.44 - 1.00 mg/dL   Calcium 9.2 8.9 - 56.2 mg/dL   Total Protein 6.9 6.5 - 8.1 g/dL   Albumin 3.2 (L) 3.5 - 5.0 g/dL   AST 17 15 - 41 U/L   ALT 11 0 - 44 U/L   Alkaline Phosphatase 76 38 - 126 U/L   Total Bilirubin 0.7 <1.2 mg/dL   GFR, Estimated >13 >08 mL/min   Anion gap 8 5 - 15  CBC     Status: Abnormal   Collection Time: 05/19/23  5:28 PM  Result Value Ref Range   WBC 7.3 4.0 - 10.5 K/uL   RBC 4.25 3.87 - 5.11 MIL/uL   Hemoglobin 9.5 (L) 12.0 - 15.0 g/dL   HCT 65.7 (L) 84.6 - 96.2 %   MCV 72.7 (L) 80.0 - 100.0 fL   MCH 22.4 (L) 26.0 - 34.0 pg   MCHC 30.7 30.0 - 36.0 g/dL   RDW 95.2 (H) 84.1 - 32.4 %   Platelets 261 150 - 400 K/uL   nRBC 0.0 0.0 - 0.2 %  Protein / creatinine ratio, urine     Status: None   Collection Time: 05/19/23  5:30 PM  Result Value Ref Range   Creatinine, Urine 143 mg/dL   Total Protein, Urine 22 mg/dL   Protein Creatinine Ratio 0.15 0.00 - 0.15 mg/mg[Cre]   No  results found.  MDM PE Labs: CBC, CMP  EFM  Assessment and Plan  22y  G2 P0010   SIUP at 26.2 weeks Cat 2 FT   - Exam findings  discussed.  Blood pressure readings normotensive here. Labs benign. - FHR with intermittent tachycardia, previously diagnosed. Followed by MFM and [facility].  - Return to MAU PRN. - Keep all scheduled antenatal appointments.  - Discharged home in stable condition.    Richardson Landry MSN, CNM 05/19/2023, 6:58 PM

## 2023-05-19 NOTE — MAU Note (Addendum)
RAHMAH WIN is a 22 y.o. at [redacted]w[redacted]d here in MAU reporting: had this HA since yesterday.  Took some Tylenol, it helped some.  Took a nap, HA still there.  BP has been up all day, 142/85 was the highest.  Sometimes sees black spots, denies epigastric pain or in crease in swelling.  Denies bleeding or LOF.  Reports +FM  Onset of complaint: yesterday Pain score: 2 Vitals:   05/19/23 1721  BP: 133/80  Pulse: 96  Resp: 16  Temp: 98.3 F (36.8 C)  SpO2: 100%     FHT:233, reports has been here before for tachycardia, was ok yesterday  and the last few times checked.  (APPs notified) Lab orders placed from triage:  urine collected.  Blood work drawn in triage

## 2023-05-19 NOTE — Discharge Instructions (Signed)

## 2023-05-24 ENCOUNTER — Ambulatory Visit: Payer: Medicaid Other

## 2023-05-24 VITALS — BP 116/73 | HR 78 | Temp 97.9°F | Resp 18 | Ht 66.0 in | Wt 129.2 lb

## 2023-05-24 DIAGNOSIS — D508 Other iron deficiency anemias: Secondary | ICD-10-CM | POA: Diagnosis not present

## 2023-05-24 DIAGNOSIS — Z3A27 27 weeks gestation of pregnancy: Secondary | ICD-10-CM

## 2023-05-24 DIAGNOSIS — R790 Abnormal level of blood mineral: Secondary | ICD-10-CM

## 2023-05-24 DIAGNOSIS — O99012 Anemia complicating pregnancy, second trimester: Secondary | ICD-10-CM

## 2023-05-24 MED ORDER — ACETAMINOPHEN 325 MG PO TABS
650.0000 mg | ORAL_TABLET | Freq: Once | ORAL | Status: AC
Start: 2023-05-24 — End: 2023-05-24
  Administered 2023-05-24: 650 mg via ORAL
  Filled 2023-05-24: qty 2

## 2023-05-24 MED ORDER — DIPHENHYDRAMINE HCL 25 MG PO CAPS
25.0000 mg | ORAL_CAPSULE | Freq: Once | ORAL | Status: AC
Start: 2023-05-24 — End: 2023-05-24
  Administered 2023-05-24: 25 mg via ORAL
  Filled 2023-05-24: qty 1

## 2023-05-24 MED ORDER — IRON SUCROSE 300 MG IVPB - SIMPLE MED
300.0000 mg | Freq: Once | Status: AC
  Administered 2023-05-24: 300 mg via INTRAVENOUS
  Filled 2023-05-24: qty 265

## 2023-05-24 NOTE — Progress Notes (Signed)
Diagnosis: Iron Deficiency Anemia  Provider:  Chilton Greathouse MD  Procedure: IV Infusion  IV Type: Peripheral, IV Location: L Antecubital  Venofer (Iron Sucrose), Dose: 300 mg  Infusion Start Time: 1023 am  Infusion Stop Time: 1208  Post Infusion IV Care: Patient declined observation and Peripheral IV Discontinued  Discharge: Condition: Good, Destination: Home . AVS Declined  Performed by:  Adriana Mccallum, RN

## 2023-05-25 ENCOUNTER — Encounter: Payer: Self-pay | Admitting: Family Medicine

## 2023-06-01 ENCOUNTER — Ambulatory Visit (INDEPENDENT_AMBULATORY_CARE_PROVIDER_SITE_OTHER): Payer: Medicaid Other | Admitting: Obstetrics & Gynecology

## 2023-06-01 ENCOUNTER — Encounter: Payer: Self-pay | Admitting: Obstetrics & Gynecology

## 2023-06-01 VITALS — BP 119/75 | HR 78 | Wt 130.0 lb

## 2023-06-01 DIAGNOSIS — O09893 Supervision of other high risk pregnancies, third trimester: Secondary | ICD-10-CM

## 2023-06-01 DIAGNOSIS — O09899 Supervision of other high risk pregnancies, unspecified trimester: Secondary | ICD-10-CM

## 2023-06-01 DIAGNOSIS — O99013 Anemia complicating pregnancy, third trimester: Secondary | ICD-10-CM

## 2023-06-01 DIAGNOSIS — Z6791 Unspecified blood type, Rh negative: Secondary | ICD-10-CM

## 2023-06-01 DIAGNOSIS — Z3A28 28 weeks gestation of pregnancy: Secondary | ICD-10-CM

## 2023-06-01 DIAGNOSIS — O26893 Other specified pregnancy related conditions, third trimester: Secondary | ICD-10-CM | POA: Diagnosis not present

## 2023-06-01 DIAGNOSIS — O0993 Supervision of high risk pregnancy, unspecified, third trimester: Secondary | ICD-10-CM

## 2023-06-01 DIAGNOSIS — R7309 Other abnormal glucose: Secondary | ICD-10-CM

## 2023-06-01 MED ORDER — RHO D IMMUNE GLOBULIN 1500 UNIT/2ML IJ SOSY
300.0000 ug | PREFILLED_SYRINGE | Freq: Once | INTRAMUSCULAR | Status: AC
  Administered 2023-06-01: 300 ug via INTRAMUSCULAR

## 2023-06-01 NOTE — Progress Notes (Signed)
PRENATAL VISIT NOTE  Subjective:  Amy Bond is a 22 y.o. G2P0010 at [redacted]w[redacted]d being seen today for ongoing prenatal care.  She is currently monitored for the following issues for this low-risk pregnancy and has Rh negative status during pregnancy; Supervision of high-risk pregnancy; Anemia of mother in pregnancy, antepartum, third trimester; Thalassemia alpha carrier; Intrauterine synechiae; Two vessel umbilical cord, antepartum; Fetal tachycardia affecting management of mother; Low ferritin; and Abnormal 2 hr glucose tolerance test (GTT) on their problem list.  Patient reports no complaints.  Contractions: Not present. Vag. Bleeding: None.  Movement: Present. Denies leaking of fluid.   The following portions of the patient's history were reviewed and updated as appropriate: allergies, current medications, past family history, past medical history, past social history, past surgical history and problem list.   Objective:   Vitals:   06/01/23 1536  BP: 119/75  Pulse: 78  Weight: 130 lb (59 kg)    Fetal Status: Fetal Heart Rate (bpm): 142   Movement: Present     General:  Alert, oriented and cooperative. Patient is in no acute distress.  Skin: Skin is warm and dry. No rash noted.   Cardiovascular: Normal heart rate noted  Respiratory: Normal respiratory effort, no problems with respiration noted  Abdomen: Soft, gravid, appropriate for gestational age.  Pain/Pressure: Absent     Pelvic: Cervical exam deferred        Extremities: Normal range of motion.  Edema: None  Mental Status: Normal mood and affect. Normal behavior. Normal judgment and thought content.   [place] MFM OB LIMITED  Result Date: 05/11/2023 ----------------------------------------------------------------------  OBSTETRICS REPORT                       (Signed Final 05/11/2023 03:49 pm) ---------------------------------------------------------------------- Patient Info  ID #:       [id number]                           D.O.B.:  Aug 08, 2000 (22 yrs)  Name:       Amy Bond [facility]                 Visit Date: 05/11/2023 03:34 pm ---------------------------------------------------------------------- Performed By  Attending:        Braxton Feathers DO       Ref. Address:     [address]                                                             [address]  Performed By:     Octaviano Batty BS       Secondary Phy.:   [facility] MAU/Triage                    RDMS  Referred By:      [facility]       Location:         [facility]                                                             [place] [facility]                                                             [facility] ---------------------------------------------------------------------- Orders  #  Description                           Code        Ordered By  1  [place] MFM OB LIMITED                     76815.01    RAVI [facility] ----------------------------------------------------------------------  #  Order #                     Accession #                Episode #  1  [id number]                   [id number]                 [id number] ---------------------------------------------------------------------- Indications  Maternal care for fetal tachycardia during     O36.8390  pregnancy  2 vessel umbilical cord                        O69.89X0  Rh negative state in antepartum                O36.0190  Anemia during pregnancy in second trimester    O99.012  Genetic carrier (Alpha Thal CARRIER)           Z14.8  [redacted] weeks gestation of pregnancy                Z3A.25  LR NIPS - Female, declined AFP ---------------------------------------------------------------------- Fetal Evaluation  Num Of Fetuses:         1  Fetal Heart Rate(bpm):  138  Cardiac Activity:       Observed  Presentation:           Breech  Placenta:               Right lateral  Amniotic Fluid  AFI FV:      Within normal limits                              Largest  Pocket(cm)                              5.98 ---------------------------------------------------------------------- Biometry  LV:        4.5  mm ---------------------------------------------------------------------- OB History  Gravidity:    2         Term:   0        Prem:   0  SAB:   1  TOP:          0       Ectopic:  0        Living: 0 ---------------------------------------------------------------------- Gestational Age  LMP:           26w 3d        Date:  11/07/22                  EDD:   08/14/23  Best:          25w 1d     Det. By:  U/S C R L  (01/05/23)    EDD:   08/23/23 ---------------------------------------------------------------------- Anatomy  Cranium:               Previously seen        Aortic Arch:            Previously seen  Cavum:                 Previously seen        Ductal Arch:            Previously seen  Ventricles:            Appears normal         Diaphragm:              Appears normal  Choroid Plexus:        Previously seen        Stomach:                Appears normal, left                                                                        sided  Cerebellum:            Previously seen        Abdomen:                Previously seen  Posterior Fossa:       Previously seen        Abdominal Wall:         Previously seen  Face:                  Orbits and profile     Cord Vessels:           Previously seen                         previously seen  Lips:                  Previously seen        Kidneys:                Appear normal  Thoracic:              Previously seen        Bladder:                Appears normal  Heart:                 Known SVT  Spine:                  Previously seen  RVOT:                  Previously seen        Upper Extremities:      Previously seen  LVOT:                  Previously seen        Lower Extremities:      Previously seen ---------------------------------------------------------------------- Cervix Uterus Adnexa  Cervix  Not visualized  (advanced GA >24wks)  Uterus  No abnormality visualized.  Right Ovary  Not visualized.  Left Ovary  Not visualized.  Adnexa  No abnormality visualized ---------------------------------------------------------------------- Comments  The patient is here for ultrasound at 25w 1d. EDD:  08/23/2023 dated by U/S C R L  (01/05/23). She is here for  fetal tachycardia which has resolved.  Sonographic findings  Single intrauterine pregnancy.  Fetal cardiac activity:  Observed and appears normal.  Presentation: Breech.  Interval fetal anatomy appears normal.  Amniotic fluid volume: Within normal limits. MVP: 5.98 cm.  Placenta: Right lateral.  Recommendations  - Serial growth ultrasounds every 4-6 weeks until delivery  - Antenatal testing around 36 weeks ----------------------------------------------------------------------                   Braxton Feathers, DO Electronically Signed Final Report   05/11/2023 03:49 pm ----------------------------------------------------------------------   [place] MFM OB FOLLOW UP  Result Date: 05/03/2023 ----------------------------------------------------------------------  OBSTETRICS REPORT                       (Signed Final 05/03/2023 11:33 am) ---------------------------------------------------------------------- Patient Info  ID #:       [id number]                          D.O.B.:  26-Jan-2001 (22 yrs)  Name:       CHANCY [facility]TA [facility]                 Visit Date: 05/03/2023 10:46 am ---------------------------------------------------------------------- Performed By  Attending:        Noralee Space MD        Ref. Address:     [address]                                                             [address]  Performed By:     Emeline Darling BS,      Secondary Phy.:   [facility] MAU/Triage                    RDMS  Referred By:      [facility]       Location:         [facility]                                                             [facility] ---------------------------------------------------------------------- Orders  #  Description                           Code        Ordered By  1  [place] MFM OB FOLLOW UP                   [phone]    Rosana Hoes ----------------------------------------------------------------------  #  Order #                     Accession #                Episode #  1  [id number]                   [id number]                 [id number] ---------------------------------------------------------------------- Indications  Maternal care for fetal tachycardia during     O36.8390  pregnancy  2 vessel umbilical cord                        O69.89X0  Rh negative state in antepartum                O36.0190  Anemia during pregnancy in second trimester    O99.012  Genetic carrier (Alpha Thal CARRIER)           Z14.8  LR NIPS - Female, declined AFP  [redacted] weeks gestation of pregnancy                Z3A.24 ---------------------------------------------------------------------- Fetal Evaluation  Num Of Fetuses:         1  Fetal Heart Rate(bpm):  141  Cardiac Activity:       Observed  Presentation:           Breech  Placenta:               Right lateral  P. Cord Insertion:      Previously seen  Amniotic Fluid  AFI FV:      Within normal limits ---------------------------------------------------------------------- Biometry  BPD:        58  mm     G. Age:  23w 5d         35  %    CI:        70.84   %    70 - 86                                                          FL/HC:      20.4   %    18.7 - 20.9  HC:      219.6  mm     G. Age:  24w 0d         32  %    HC/AC:  1.06        1.05 - 1.21  AC:       208   mm     G. Age:  25w 3d         82  %    FL/BPD:     77.1   %    71 - 87  FL:       44.7  mm     G. Age:  24w 5d         62  %    FL/AC:      21.5   %    20 - 24  Est. FW:     749  gm    1 lb 10 oz      82  %  ---------------------------------------------------------------------- OB History  Gravidity:    2         Term:   0        Prem:   0        SAB:   1  TOP:          0       Ectopic:  0        Living: 0 ---------------------------------------------------------------------- Gestational Age  LMP:           25w 2d        Date:  11/07/22                  EDD:   08/14/23  U/S Today:     24w 3d                                        EDD:   08/20/23  Best:          24w 0d     Det. By:  U/S C R L  (01/05/23)    EDD:   08/23/23 ---------------------------------------------------------------------- Targeted Anatomy  Central Nervous System  Calvarium/Cranial V.:  Appears normal         Cereb./Vermis:          Previously seen  Cavum:                 Previously seen        Sales executive:         Previously seen  Lateral Ventricles:    Appears normal         Midline Falx:           Previously seen  Choroid Plexus:        Previously seen  Spine  Cervical:              Previously seen        Sacral:                 Not well visualized  Thoracic:              Previously seen        Shape/Curvature:        Previously seen  Lumbar:                Not well visualized  Head/Neck  Lips:                  Previously seen        Profile:  Previously seen  Neck:                  Previously seen        Orbits/Eyes:            Previously seen  Nuchal Fold:           Previously seen        Mandible:               Previously seen  Nasal Bone:            Previously seen        Maxilla:                Previously seen  Palate:                Previously seen  Thorax  4 Chamber View:        Appears normal         SVC:                    Appears normal  Cardiac Activity:      Observed               Interventr. Septum:     Previously seen  Cardiac Rhythm:        Normal                 Cardiac Axis:           Previously een  Cardiac Situs:         Appears normal         Diaphragm:              Appears normal  Rt Outflow Tract:      Previously  seen        3 Vessel View:          Previously seen  Lt Outflow Tract:      Appears normal         3 V Trachea View:       Previously seen  Aortic Arch:           Previously seen        IVC:                    Appears normal  Ductal Arch:           Previously seen        Crossing:               Previously seen  Abdomen  Ventral Wall:          Previously seen        Lt Kidney:              Appears normal  Cord Insertion:        Previously seen        Rt Kidney:              Appears normal  Situs:                 Appears normal         Bladder:                Appears normal  Stomach:               Appears normal  Extremities  Lt Humerus:  Previously seen        Lt Femur:               Previously seen  Rt Humerus:            Previously seen        Rt Femur:               Previously seen  Lt Forearm:            Previously seen        Lt Lower Leg:           Previously seen  Rt Forearm:            Previously seen        Rt Lower Leg:           Previously seen  Lt Hand:               Previously seen        Lt Foot:                Previously seen  Rt Hand:               Previously seen        Rt Foot:                Previously seen  Other  Umbilical Cord:        Single UA prev         Genitalia:              Female-nml  Comment:     Technically difficult due to fetal position. ---------------------------------------------------------------------- Cervix Uterus Adnexa  Cervix  Length:            3.3  cm.  Not visualized (advanced GA >24wks) ---------------------------------------------------------------------- Impression  Patient was evaluated last week for fetal tachycardia.  Subsequently, she had fetal echocardiography that ruled out  structural heart malformations.  Fetal heart rate rhythm  spontaneously restored to normal.  Single umbilical artery.  On today's ultrasound, amniotic fluid is normal and good fetal  activity seen.  Fetal heart rate and rhythm appear normal.  Fetal growth is appropriate for  gestational age.  Minimal physiological pericardial effusion is seen.  I explained the findings and reassured the couple. ---------------------------------------------------------------------- Recommendations  -Follow-up ultrasound to assess fetal heart rate and rhythm  next week.  -Fetal growth assessment in 4 weeks. ----------------------------------------------------------------------                 Noralee Space, MD Electronically Signed Final Report   05/03/2023 11:33 am ----------------------------------------------------------------------    Assessment and Plan:  Pregnancy: G2P0010 at [redacted]w[redacted]d 1. Abnormal 2 hr glucose tolerance test (GTT) Was not fasting, counseled about repeating test. She only wants to do 1 hr GTT.  Advised to eat protein filled breakfast, avoid a lot of sugar/carbohydrates that morning.  If elevated again, will need confirmatory 2 or 3 hr GTT.  2. Two vessel umbilical cord, antepartum Follow up MFM scans as recommended  3. Anemia of mother in pregnancy, antepartum, third trimester She is s/p IV iron, on oral iron for now.  4. Rh negative status during pregnancy in third trimester Rhogam given today.  5. [redacted] weeks gestation of pregnancy 6. Supervision of high risk pregnancy in third trimester No other concerns. Preterm labor symptoms and general obstetric precautions including but not limited to vaginal bleeding, contractions, leaking of fluid and fetal movement were reviewed in detail with  the patient. Please refer to After Visit Summary for other counseling recommendations.   Return in about 2 weeks (around 06/15/2023) for OFFICE OB VISIT (MD only) and 1 hr GTT.  Future Appointments  Date Time Provider Department Center  06/02/2023  3:15 PM [facility] NURSE [facility]  06/02/2023  3:30 PM [facility]5 [facility]  06/15/2023  3:50 PM Shaeleigh Graw, Jethro Bastos, MD [facility]  06/29/2023  3:50 PM Kyann Heydt, Jethro Bastos, MD [facility]  07/13/2023  3:30 PM  Zulema Pulaski, Jethro Bastos, MD [facility]  07/27/2023  3:30 PM [place] Bing, MD [facility]    Jaynie Collins, MD

## 2023-06-01 NOTE — Addendum Note (Signed)
Addended by: Kennon Portela on: 06/01/2023 04:08 PM   Modules accepted: Orders

## 2023-06-02 ENCOUNTER — Other Ambulatory Visit: Payer: Self-pay

## 2023-06-02 ENCOUNTER — Other Ambulatory Visit: Payer: Self-pay | Admitting: *Deleted

## 2023-06-02 ENCOUNTER — Ambulatory Visit: Payer: Medicaid Other | Attending: Obstetrics and Gynecology

## 2023-06-02 ENCOUNTER — Ambulatory Visit: Payer: Medicaid Other | Admitting: *Deleted

## 2023-06-02 VITALS — BP 138/72 | HR 86

## 2023-06-02 DIAGNOSIS — R7309 Other abnormal glucose: Secondary | ICD-10-CM | POA: Insufficient documentation

## 2023-06-02 DIAGNOSIS — Z3A28 28 weeks gestation of pregnancy: Secondary | ICD-10-CM

## 2023-06-02 DIAGNOSIS — O09899 Supervision of other high risk pregnancies, unspecified trimester: Secondary | ICD-10-CM | POA: Diagnosis present

## 2023-06-02 DIAGNOSIS — O360130 Maternal care for anti-D [Rh] antibodies, third trimester, not applicable or unspecified: Secondary | ICD-10-CM | POA: Diagnosis not present

## 2023-06-02 DIAGNOSIS — N856 Intrauterine synechiae: Secondary | ICD-10-CM | POA: Diagnosis present

## 2023-06-02 DIAGNOSIS — Z3689 Encounter for other specified antenatal screening: Secondary | ICD-10-CM | POA: Diagnosis present

## 2023-06-02 DIAGNOSIS — O358XX0 Maternal care for other (suspected) fetal abnormality and damage, not applicable or unspecified: Secondary | ICD-10-CM | POA: Diagnosis not present

## 2023-06-02 DIAGNOSIS — O24410 Gestational diabetes mellitus in pregnancy, diet controlled: Secondary | ICD-10-CM | POA: Diagnosis not present

## 2023-06-02 DIAGNOSIS — O0993 Supervision of high risk pregnancy, unspecified, third trimester: Secondary | ICD-10-CM | POA: Diagnosis present

## 2023-06-02 DIAGNOSIS — O368330 Maternal care for abnormalities of the fetal heart rate or rhythm, third trimester, not applicable or unspecified: Secondary | ICD-10-CM | POA: Diagnosis not present

## 2023-06-02 DIAGNOSIS — D563 Thalassemia minor: Secondary | ICD-10-CM

## 2023-06-02 DIAGNOSIS — O99013 Anemia complicating pregnancy, third trimester: Secondary | ICD-10-CM

## 2023-06-15 ENCOUNTER — Other Ambulatory Visit: Payer: Medicaid Other

## 2023-06-15 ENCOUNTER — Ambulatory Visit (INDEPENDENT_AMBULATORY_CARE_PROVIDER_SITE_OTHER): Payer: Medicaid Other | Admitting: Obstetrics & Gynecology

## 2023-06-15 VITALS — BP 122/82 | HR 94 | Wt 130.4 lb

## 2023-06-15 DIAGNOSIS — O360130 Maternal care for anti-D [Rh] antibodies, third trimester, not applicable or unspecified: Secondary | ICD-10-CM

## 2023-06-15 DIAGNOSIS — O23593 Infection of other part of genital tract in pregnancy, third trimester: Secondary | ICD-10-CM

## 2023-06-15 DIAGNOSIS — R7309 Other abnormal glucose: Secondary | ICD-10-CM

## 2023-06-15 DIAGNOSIS — O99810 Abnormal glucose complicating pregnancy: Secondary | ICD-10-CM

## 2023-06-15 DIAGNOSIS — N856 Intrauterine synechiae: Secondary | ICD-10-CM

## 2023-06-15 DIAGNOSIS — Z3A30 30 weeks gestation of pregnancy: Secondary | ICD-10-CM

## 2023-06-15 DIAGNOSIS — O0993 Supervision of high risk pregnancy, unspecified, third trimester: Secondary | ICD-10-CM

## 2023-06-15 DIAGNOSIS — O09899 Supervision of other high risk pregnancies, unspecified trimester: Secondary | ICD-10-CM

## 2023-06-15 DIAGNOSIS — O26893 Other specified pregnancy related conditions, third trimester: Secondary | ICD-10-CM

## 2023-06-15 DIAGNOSIS — Z6791 Unspecified blood type, Rh negative: Secondary | ICD-10-CM

## 2023-06-15 NOTE — Progress Notes (Signed)
PRENATAL VISIT NOTE  Subjective:  Amy Bond is a 22 y.o. G2P0010 at [redacted]w[redacted]d being seen today for ongoing prenatal care.  She is currently monitored for the following issues for this high-risk pregnancy and has Rh negative status during pregnancy; Supervision of high-risk pregnancy; Anemia of mother in pregnancy, antepartum, third trimester; Thalassemia alpha carrier; Intrauterine synechiae; Two vessel umbilical cord, antepartum; Fetal tachycardia affecting management of mother; Low ferritin; and Abnormal 2 hr glucose tolerance test (GTT) on their problem list.  Patient reports no complaints.  Contractions: Not present. Vag. Bleeding: None.  Movement: Present. Denies leaking of fluid.   The following portions of the patient's history were reviewed and updated as appropriate: allergies, current medications, past family history, past medical history, past social history, past surgical history and problem list.   Objective:   Vitals:   06/15/23 1557  BP: 122/82  Pulse: 94  Weight: 130 lb 6.4 oz (59.1 kg)    Fetal Status: Fetal Heart Rate (bpm): 154 Fundal Height: 29 cm Movement: Present     General:  Alert, oriented and cooperative. Patient is in no acute distress.  Skin: Skin is warm and dry. No rash noted.   Cardiovascular: Normal heart rate noted  Respiratory: Normal respiratory effort, no problems with respiration noted  Abdomen: Soft, gravid, appropriate for gestational age.  Pain/Pressure: Absent     Pelvic: Cervical exam deferred        Extremities: Normal range of motion.  Edema: None  Mental Status: Normal mood and affect. Normal behavior. Normal judgment and thought content.   Imaging: [place] MFM OB FOLLOW UP Result Date: 06/02/2023 ----------------------------------------------------------------------  OBSTETRICS REPORT                       (Signed Final 06/02/2023 03:53 pm) ---------------------------------------------------------------------- Patient Info  ID #:        [id number]                          D.O.B.:  March 16, 2001 (22 yrs)(F)  Name:       Amy Bond [facility]                 Visit Date: 06/02/2023 03:10 pm ---------------------------------------------------------------------- Performed By  Attending:        Ma Rings MD         Ref. Address:     [address]                                                             [address]  Performed By:     Anabel Halon          Secondary Phy.:   [facility] MAU/Triage                    RDMS  Referred By:      [facility]       [facility] ---------------------------------------------------------------------- Orders  #  Description                           Code        Ordered By  1  [place] MFM OB FOLLOW UP                   30865.78    Braxton Feathers ----------------------------------------------------------------------  #  Order #                     Accession #                Episode #  1  [id number]                   [id number]                 [id number] ---------------------------------------------------------------------- Indications  2 vessel umbilical cord                        O69.89X0  Gestational diabetes in pregnancy, diet        O24.410  controlled  Maternal care for fetal tachycardia during     O36.8390  pregnancy  Rh negative state in antepartum                O36.0190  Genetic carrier (Alpha Thal CARRIER)           Z14.8  Anemia during pregnancy in third trimester     O99.013  [redacted] weeks gestation of pregnancy                Z3A.28  Antenatal follow-up for nonvisualized fetal    Z36.2  anatomy  LR NIPS - Female, declined AFP ---------------------------------------------------------------------- Fetal Evaluation  Num Of Fetuses:         1  Fetal Heart Rate(bpm):  135  Cardiac Activity:        Observed  Presentation:           Breech  Placenta:               Right lateral  P. Cord Insertion:      Previously seen  Amniotic Fluid  AFI FV:      Within normal limits  AFI Sum(cm)     %Tile       Largest Pocket(cm)  20.45           82          5.69  RUQ(cm)       RLQ(cm)       LUQ(cm)        LLQ(cm)  3.94  5.26          5.69           5.56 ---------------------------------------------------------------------- Biometry  BPD:      72.1  mm     G. Age:  29w 0d         60  %    CI:        77.11   %    70 - 86                                                          FL/HC:      20.5   %    18.8 - 20.6  HC:       260   mm     G. Age:  28w 2d         19  %    HC/AC:      1.09        1.05 - 1.21  AC:      238.8  mm     G. Age:  28w 1d         39  %    FL/BPD:     73.8   %    71 - 87  FL:       53.2  mm     G. Age:  28w 2d         33  %    FL/AC:      22.3   %    20 - 24  LV:        2.6  mm  Est. FW:    1199  gm    2 lb 10 oz      36  % ---------------------------------------------------------------------- OB History  Gravidity:    2         Term:   0        Prem:   0        SAB:   1  TOP:          0       Ectopic:  0        Living: 0 ---------------------------------------------------------------------- Gestational Age  LMP:           29w 4d        Date:  11/07/22                  EDD:   08/14/23  U/S Today:     28w 3d                                        EDD:   08/22/23  Best:          28w 2d     Det. By:  U/S C R L  (01/05/23)    EDD:   08/23/23 ---------------------------------------------------------------------- Targeted Anatomy  Central Nervous System  Calvarium/Cranial V.:  Appears normal         Cereb./Vermis:          Previously seen  Cavum:                 Previously seen  Cisterna Magna:         Previously seen  Lateral Ventricles:    Previously seen        Midline Falx:           Previously seen  Choroid Plexus:        Previously seen  Spine  Cervical:              Previously seen        Sacral:                  Not well visualized  Thoracic:              Previously seen        Shape/Curvature:        Previously seen  Lumbar:                Not well visualized  Head/Neck  Lips:                  Previously seen        Profile:                Previously seen  Neck:                  Previously seen        Orbits/Eyes:            Previously seen  Nuchal Fold:           Previously seen        Mandible:               Previously seen  Nasal Bone:            Previously seen        Maxilla:                Previously seen  Palate:                Previously seen  Thorax  4 Chamber View:        Previously seen        Interventr. Septum:     Previously seen  Cardiac Activity:      Observed               Cardiac Axis:           Previously een  Cardiac Situs:         Previously seen        Diaphragm:              Appears normal  Rt Outflow Tract:      Previously seen        3 Vessel View:          Previously seen  Lt Outflow Tract:      Previously seen        3 V Trachea View:       Previously seen  Aortic Arch:           Previously seen        IVC:                    Previously seen  Ductal Arch:           Previously seen        Crossing:               Previously seen  SVC:  Previously seen  Abdomen  Ventral Wall:          Previously seen        Lt Kidney:              Appears normal  Cord Insertion:        Previously seen        Rt Kidney:              Appears normal  Situs:                 Previously seen        Bladder:                Appears normal  Stomach:               Appears normal  Extremities  Lt Humerus:            Previously seen        Lt Femur:               Previously seen  Rt Humerus:            Previously seen        Rt Femur:               Previously seen  Lt Forearm:            Previously seen        Lt Lower Leg:           Previously seen  Rt Forearm:            Previously seen        Rt Lower Leg:           Previously seen  Lt Hand:               Previously seen        Lt Foot:                 Previously seen  Rt Hand:               Previously seen        Rt Foot:                Previously seen  Other  Umbilical Cord:        Single UA prev         Genitalia:              Previously seen ---------------------------------------------------------------------- Cervix Uterus Adnexa  Cervix  Not visualized (advanced GA >24wks) ---------------------------------------------------------------------- Comments  This patient was seen for a follow up growth scan due to a  fetus with a two-vessel umbilical cord.  She recently failed her 2-hour glucose challenge test.  However, the patient reports that she ate right before taking  the test and will rescreened again soon.  She was informed that the fetal growth and amniotic fluid  level appears appropriate for her gestational age.  A two-vessel umbilical cord continues to be noted on today's  exam.  There were no signs of fetal tachycardia.  A follow-up growth scan was scheduled in 6 weeks. ----------------------------------------------------------------------                   Ma Rings, MD Electronically Signed Final Report   06/02/2023 03:53 pm ----------------------------------------------------------------------    Assessment and Plan:  Pregnancy: G2P0010 at [redacted]w[redacted]d 1. Two vessel umbilical cord, antepartum (Primary) 2. Intrauterine synechiae Follow up scans  as per MFM.  3. Abnormal 2 hr glucose tolerance test (GTT) 1 hr GTT done earlier today, will follow up results and manage accordingly.  4. Rh negative status during pregnancy in third trimester Rhogam already given.  5. [redacted] weeks gestation of pregnancy 6. Supervision of high risk pregnancy in third trimester No other concerns.  Preterm labor symptoms and general obstetric precautions including but not limited to vaginal bleeding, contractions, leaking of fluid and fetal movement were reviewed in detail with the patient. Please refer to After Visit Summary for other counseling  recommendations.   Return in about 2 weeks (around 06/29/2023).  Future Appointments  Date Time Provider Department Center  06/29/2023  3:50 PM Jaylissa Felty, Jethro Bastos, MD [facility]  07/13/2023  3:30 PM Japneet Staggs, Jethro Bastos, MD [facility]  07/14/2023  3:30 PM [facility]  07/27/2023  3:30 PM [place] Bing, MD [facility]    Jaynie Collins, MD

## 2023-06-15 NOTE — Patient Instructions (Signed)

## 2023-06-17 ENCOUNTER — Encounter: Payer: Self-pay | Admitting: Obstetrics & Gynecology

## 2023-06-17 LAB — GLUCOSE TOLERANCE, 1 HOUR: Glucose, 1Hr PP: 115 mg/dL (ref 70–199)

## 2023-06-18 ENCOUNTER — Encounter (HOSPITAL_COMMUNITY): Payer: Self-pay | Admitting: Obstetrics and Gynecology

## 2023-06-18 ENCOUNTER — Inpatient Hospital Stay (HOSPITAL_COMMUNITY)
Admission: AD | Admit: 2023-06-18 | Discharge: 2023-06-18 | Disposition: A | Discharge status: Home / Self Care | Payer: Medicaid Other | Attending: Obstetrics and Gynecology | Admitting: Obstetrics and Gynecology

## 2023-06-18 DIAGNOSIS — R1032 Left lower quadrant pain: Secondary | ICD-10-CM

## 2023-06-18 DIAGNOSIS — Z87891 Personal history of nicotine dependence: Secondary | ICD-10-CM | POA: Insufficient documentation

## 2023-06-18 DIAGNOSIS — M549 Dorsalgia, unspecified: Secondary | ICD-10-CM | POA: Diagnosis present

## 2023-06-18 DIAGNOSIS — O99891 Other specified diseases and conditions complicating pregnancy: Secondary | ICD-10-CM

## 2023-06-18 DIAGNOSIS — R109 Unspecified abdominal pain: Secondary | ICD-10-CM | POA: Insufficient documentation

## 2023-06-18 DIAGNOSIS — Z3689 Encounter for other specified antenatal screening: Secondary | ICD-10-CM

## 2023-06-18 DIAGNOSIS — Z3A30 30 weeks gestation of pregnancy: Secondary | ICD-10-CM | POA: Diagnosis not present

## 2023-06-18 DIAGNOSIS — N856 Intrauterine synechiae: Secondary | ICD-10-CM

## 2023-06-18 DIAGNOSIS — R102 Pelvic and perineal pain unspecified side: Secondary | ICD-10-CM

## 2023-06-18 DIAGNOSIS — O09899 Supervision of other high risk pregnancies, unspecified trimester: Secondary | ICD-10-CM

## 2023-06-18 DIAGNOSIS — R1030 Lower abdominal pain, unspecified: Secondary | ICD-10-CM | POA: Diagnosis not present

## 2023-06-18 DIAGNOSIS — O0993 Supervision of high risk pregnancy, unspecified, third trimester: Secondary | ICD-10-CM

## 2023-06-18 DIAGNOSIS — O26893 Other specified pregnancy related conditions, third trimester: Secondary | ICD-10-CM

## 2023-06-18 LAB — WET PREP, GENITAL
Clue Cells Wet Prep HPF POC: NONE SEEN
Sperm: NONE SEEN
Trich, Wet Prep: NONE SEEN
WBC, Wet Prep HPF POC: >=10 — AB (ref <10)
Yeast Wet Prep HPF POC: NONE SEEN

## 2023-06-18 LAB — URINALYSIS, ROUTINE W REFLEX MICROSCOPIC
Bilirubin Urine: NEGATIVE
Glucose, UA: NEGATIVE mg/dL
Hgb urine dipstick: NEGATIVE
Ketones, ur: NEGATIVE mg/dL
Nitrite: NEGATIVE
Protein, ur: NEGATIVE mg/dL
Specific Gravity, Urine: 1.006 (ref 1.005–1.030)
pH: 8 (ref 5.0–8.0)

## 2023-06-18 NOTE — MAU Note (Signed)
Pt says- started  yesterday baby feels low and heavy and  abd tight - and back pain- 4/10 Last sex- last week. [facility]

## 2023-06-18 NOTE — MAU Provider Note (Signed)
History     CSN: [id number]  Arrival date and time: 06/18/23 2127   Event Date/Time   First Provider Initiated Contact with Patient 06/18/23 2227      Chief Complaint  Patient presents with  . Back Pain   Amy Bond is a 22 y.o. G2P0010 at 109w4d who receives care at [facility].  She presents today for abdominal cramping and pelvic pressure. She states her symptoms started yesterday and have been constant since onset.  She describes the pain as a heaviness and tightness in her lower abdomen and back.  She rates the pain a 4/10 and states it was a 7/10 at onset.  She states the pain is worse with walking and improved with rest.  She denies vaginal irritation, itching, or odor despite increased discharge.  No recent sexual activity.    OB History     Gravida  2   Para      Term      Preterm      AB  1   Living         SAB  1   IAB      Ectopic      Multiple      Live Births              Past Medical History:  Diagnosis Date  . Anemia   . Anxiety   . UTI (urinary tract infection)     Past Surgical History:  Procedure Laterality Date  . NO PAST SURGERIES      Family History  Problem Relation Age of Onset  . Healthy Mother   . Other Mother        2021- killed  . Healthy Father   . Diabetes Paternal Grandmother   . Cancer Neg Hx   . Asthma Neg Hx   . Heart disease Neg Hx   . Hypertension Neg Hx     Social History   Tobacco Use  . Smoking status: Never  . Smokeless tobacco: Never  . Tobacco comments:    Quit vaping May 2024  Vaping Use  . Vaping status: Former  . Substances: Nicotine, Flavoring  Substance Use Topics  . Alcohol use: Never  . Drug use: Never    Allergies: No Known Allergies  Medications Prior to Admission  Medication Sig Dispense Refill Last Dose/Taking  . Prenatal Vit-Fe Fumarate-FA (PRENATAL MULTIVITAMIN) TABS tablet Take 1 tablet by mouth daily at 12 noon.   06/18/2023  . Accu-Chek Softclix Lancets lancets 1 each  by Other route 4 (four) times daily. 100 each 12   . aspirin EC 81 MG tablet Take 1 tablet (81 mg total) by mouth at bedtime. Start taking when you are [redacted] weeks pregnant for rest of pregnancy for prevention of preeclampsia 300 tablet 2 06/11/2023  . Blood Glucose Monitoring Suppl (ACCU-CHEK GUIDE) w/Device KIT 1 Device by Does not apply route 4 (four) times daily. 1 kit 0   . Ferric Maltol 30 MG CAPS Take 1 capsule (30 mg total) by mouth every other day. Please take one hour before breakfast or dinner   06/16/2023  . glucose blood test strip Use as instructed 100 each 12   . ondansetron (ZOFRAN-ODT) 4 MG disintegrating tablet Take 1 tablet (4 mg total) by mouth every 6 (six) hours as needed for nausea. 20 tablet 2 06/04/2023    Review of Systems  Constitutional:  Negative for fever.  Gastrointestinal:  Positive for abdominal pain and diarrhea (Loose  Stools 4-5x yesterday). Negative for nausea and vomiting.  Genitourinary:  Positive for pelvic pain (Pressure). Negative for difficulty urinating, dysuria, vaginal bleeding and vaginal discharge.  Musculoskeletal:  Positive for back pain.   Physical Exam   Blood pressure 130/82, pulse 75, temperature 98.1 F (36.7 C), temperature source Oral, resp. rate 16, height 5\' 6"  (1.676 m), weight 60.4 kg, last menstrual period 11/07/2022, unknown if currently breastfeeding.  Physical Exam Vitals and nursing note reviewed. Exam conducted with a chaperone present Warnell Forester, NT).  Constitutional:      Appearance: Normal appearance.  HENT:     Head: Normocephalic and atraumatic.  Eyes:     Conjunctiva/sclera: Conjunctivae normal.  Cardiovascular:     Rate and Rhythm: Normal rate.  Pulmonary:     Effort: Pulmonary effort is normal. No respiratory distress.     Breath sounds: Normal breath sounds.  Abdominal:     Palpations: Abdomen is soft.     Tenderness: There is no abdominal tenderness.     Comments: Gravid, AGA  Genitourinary:    Comments: Wet  prep collected blindly Dilation: Closed Effacement (%): Thick Cervical Position: Posterior Presentation: Undeterminable Exam by:: J.Shanterica Biehler, CNM  Musculoskeletal:        General: Normal range of motion.     Cervical back: Normal range of motion.  Skin:    General: Skin is warm and dry.  Neurological:     Mental Status: She is alert and oriented to person, place, and time.  Psychiatric:        Mood and Affect: Mood normal.        Behavior: Behavior normal.    Fetal Assessment 135 bpm, Mod Var, -Decels, +Accels Toco: No ctx graphed  MAU Course   Results for orders placed or performed during the hospital encounter of 06/18/23 (from the past 24 hours)  Urinalysis, Routine w reflex microscopic -Urine, Clean Catch     Status: Abnormal   Collection Time: 06/18/23 10:28 PM  Result Value Ref Range   Color, Urine STRAW (A) YELLOW   APPearance CLEAR CLEAR   Specific Gravity, Urine 1.006 1.005 - 1.030   pH 8.0 5.0 - 8.0   Glucose, UA NEGATIVE NEGATIVE mg/dL   Hgb urine dipstick NEGATIVE NEGATIVE   Bilirubin Urine NEGATIVE NEGATIVE   Ketones, ur NEGATIVE NEGATIVE mg/dL   Protein, ur NEGATIVE NEGATIVE mg/dL   Nitrite NEGATIVE NEGATIVE   Leukocytes,Ua TRACE (A) NEGATIVE   RBC / HPF 0-5 0 - 5 RBC/hpf   WBC, UA 0-5 0 - 5 WBC/hpf   Bacteria, UA RARE (A) NONE SEEN   Squamous Epithelial / HPF 0-5 0 - 5 /HPF  Wet prep, genital     Status: Abnormal   Collection Time: 06/18/23 10:35 PM  Result Value Ref Range   Yeast Wet Prep HPF POC NONE SEEN NONE SEEN   Trich, Wet Prep NONE SEEN NONE SEEN   Clue Cells Wet Prep HPF POC NONE SEEN NONE SEEN   WBC, Wet Prep HPF POC >=10 (A) <10   Sperm NONE SEEN    No results found.  MDM PE Labs: UA, Wet prep EFM Assessment and Plan  22 year old G2P0010  SIUP at 30.4 weeks Cat I FT Abdominal Cramping Pelvic Pressure   -Exam findings discussed. Amy Robins MSN, CNM 06/18/2023, 10:27 PM

## 2023-06-18 NOTE — Discharge Instructions (Signed)
   PREGNANCY SUPPORT BELT: You are not alone, Seventy-five percent of women have some sort of abdominal or back pain at some point in their pregnancy. Your baby is growing at a fast pace, which means that your whole body is rapidly trying to adjust to the changes. As your uterus grows, your back may start feeling a bit under stress and this can result in back or abdominal pain that can go from mild, and therefore bearable, to severe pains that will not allow you to sit or lay down comfortably, When it comes to dealing with pregnancy-related pains and cramps, some pregnant women usually prefer natural remedies, which the market is filled with nowadays. For example, wearing a pregnancy support belt can help ease and lessen your discomfort and pain. WHAT ARE THE BENEFITS OF WEARING A PREGNANCY SUPPORT BELT? A pregnancy support belt provides support to the lower portion of the belly taking some of the weight of the growing uterus and distributing to the other parts of your body. It is designed make you comfortable and gives you extra support. Over the years, the pregnancy apparel market has been studying the needs and wants of pregnant women and they have come up with the most comfortable pregnancy support belts that woman could ever ask for. In fact, you will no longer have to wear a stretched-out or bulky pregnancy belt that is visible underneath your clothes and makes you feel even more uncomfortable. Nowadays, a pregnancy support belt is made of comfortable and stretchy materials that will not irritate your skin but will actually make you feel at ease and you will not even notice you are wearing it. They are easy to put on and adjust during the day and can be worn at night for additional support.  BENEFITS: Relives Back pain Relieves Abdominal Muscle and Leg Pain Stabilizes the Pelvic Ring Offers a Cushioned Abdominal Lift Pad Relieves pressure on the Sciatic Nerve Within Minutes    Locations that Carry  Maternity/Pregnancy Support Belts  You can find belts on [employer] starting at $10-$15.  2.  The [facility] carries belts for $10-$15.        Address/Phone: [address], [address], [place], [place] [address], [phone]  3.  You may be able to file your insurance with [email or web address] and have a belt mailed to you.  If you have any problems getting the belt, let your [facility] office know.  

## 2023-06-28 ENCOUNTER — Other Ambulatory Visit: Payer: Self-pay

## 2023-06-28 ENCOUNTER — Inpatient Hospital Stay (HOSPITAL_COMMUNITY)
Admission: AD | Admit: 2023-06-28 | Discharge: 2023-06-28 | Disposition: A | Discharge status: Home / Self Care | Payer: Medicaid Other | Attending: Obstetrics and Gynecology | Admitting: Obstetrics and Gynecology

## 2023-06-28 ENCOUNTER — Encounter (HOSPITAL_COMMUNITY): Payer: Self-pay | Admitting: Obstetrics and Gynecology

## 2023-06-28 DIAGNOSIS — N856 Intrauterine synechiae: Secondary | ICD-10-CM | POA: Insufficient documentation

## 2023-06-28 DIAGNOSIS — O98513 Other viral diseases complicating pregnancy, third trimester: Secondary | ICD-10-CM | POA: Insufficient documentation

## 2023-06-28 DIAGNOSIS — O0993 Supervision of high risk pregnancy, unspecified, third trimester: Secondary | ICD-10-CM | POA: Diagnosis present

## 2023-06-28 DIAGNOSIS — U071 COVID-19: Secondary | ICD-10-CM

## 2023-06-28 DIAGNOSIS — O26893 Other specified pregnancy related conditions, third trimester: Secondary | ICD-10-CM | POA: Diagnosis not present

## 2023-06-28 DIAGNOSIS — O09899 Supervision of other high risk pregnancies, unspecified trimester: Secondary | ICD-10-CM

## 2023-06-28 DIAGNOSIS — Z3A32 32 weeks gestation of pregnancy: Secondary | ICD-10-CM | POA: Diagnosis not present

## 2023-06-28 LAB — URINALYSIS, ROUTINE W REFLEX MICROSCOPIC
Bilirubin Urine: NEGATIVE
Glucose, UA: NEGATIVE mg/dL
Hgb urine dipstick: NEGATIVE
Ketones, ur: 80 mg/dL — AB
Leukocytes,Ua: NEGATIVE
Nitrite: NEGATIVE
Protein, ur: NEGATIVE mg/dL
Specific Gravity, Urine: 1.016 (ref 1.005–1.030)
pH: 6 (ref 5.0–8.0)

## 2023-06-28 LAB — RESP PANEL BY RT-PCR (RSV, FLU A&B, COVID)  RVPGX2
Influenza A by PCR: NEGATIVE
Influenza B by PCR: NEGATIVE
Resp Syncytial Virus by PCR: NEGATIVE
SARS Coronavirus 2 by RT PCR: POSITIVE — AB

## 2023-06-28 MED ORDER — CYCLOBENZAPRINE HCL 5 MG PO TABS: 10.0000 mg | ORAL_TABLET | Freq: Three times a day (TID) | ORAL | Status: DC | PRN

## 2023-06-28 MED ORDER — ONDANSETRON 4 MG PO TBDP
4.0000 mg | ORAL_TABLET | Freq: Once | ORAL | Status: AC
Start: 2023-06-28 — End: 2023-06-28
  Administered 2023-06-28: 4 mg via ORAL
  Filled 2023-06-28: qty 1

## 2023-06-28 MED ORDER — ACETAMINOPHEN 500 MG PO TABS
1000.0000 mg | ORAL_TABLET | Freq: Once | ORAL | Status: AC
  Administered 2023-06-28: 1000 mg via ORAL
  Filled 2023-06-28: qty 2

## 2023-06-28 MED ORDER — NIRMATRELVIR/RITONAVIR (PAXLOVID)TABLET: 3.0000 | ORAL_TABLET | Freq: Two times a day (BID) | ORAL | 0 refills | Status: AC

## 2023-06-28 NOTE — MAU Note (Addendum)
Amy Bond is a 22 y.o. at [redacted]w[redacted]d here in MAU reporting: she feels hot and weak, has chills and a HA.  Reports took BP @ home, BP 130/82.  Denies taking meds to treat HA.  Denies VB or LOF.  Endorses +FM, less than usual.  LMP: NA Onset of complaint: today Pain score: 8 Vitals:   06/28/23 1729  BP: 117/68  Pulse: (!) 125  Resp: 20  Temp: 99.4 F (37.4 C)  SpO2: 100%     FHT:204 bpm Lab orders placed from triage: UA

## 2023-06-28 NOTE — MAU Note (Signed)
RN called Microbiology to discuss Respiratory panel. Lab tech states the specimen is on the machine and it has 7 minutes left til the results. Lab tech states it was never received into the computer so it doesn't say "in process" but she will receive it in now.

## 2023-06-28 NOTE — MAU Provider Note (Signed)
Chief Complaint:  Weakness, Headache, and Chills  HPI  HPI: Amy Bond is a 22 y.o. G2P0010 at 81w0dwho presents to maternity admissions reporting feeling feverish, chills, 8/10 headache and body aches since 1300. Has not tried anything for pain.  She reports good fetal movement, denies LOF, vaginal bleeding, vaginal itching/burning, urinary symptoms, dizziness, n/v, diarrhea, constipation or fever/chills.  She denies headache, visual changes or RUQ abdominal pain.   Past Medical History: Past Medical History:  Diagnosis Date   Anemia    Anxiety    UTI (urinary tract infection)     Past obstetric history: OB History  Gravida Para Term Preterm AB Living  2    1   SAB IAB Ectopic Multiple Live Births  1        # Outcome Date GA Lbr Len/2nd Weight Sex Type Anes PTL Lv  2 Current           1 SAB 05/07/22            Past Surgical History: Past Surgical History:  Procedure Laterality Date   NO PAST SURGERIES      Family History: Family History  Problem Relation Age of Onset   Healthy Mother    Other Mother        2021- killed   Healthy Father    Diabetes Paternal Grandmother    Cancer Neg Hx    Asthma Neg Hx    Heart disease Neg Hx    Hypertension Neg Hx     Social History: Social History   Tobacco Use   Smoking status: Never   Smokeless tobacco: Never   Tobacco comments:    Quit vaping May 2024  Vaping Use   Vaping status: Former   Substances: Nicotine, Flavoring  Substance Use Topics   Alcohol use: Never   Drug use: Never    Allergies: No Known Allergies  Meds:  Medications Prior to Admission  Medication Sig Dispense Refill Last Dose/Taking   aspirin EC 81 MG tablet Take 1 tablet (81 mg total) by mouth at bedtime. Start taking when you are [redacted] weeks pregnant for rest of pregnancy for prevention of preeclampsia 300 tablet 2 Past Week   Prenatal Vit-Fe Fumarate-FA (PRENATAL MULTIVITAMIN) TABS tablet Take 1 tablet by mouth daily at 12 noon.   Past  Week   Accu-Chek Softclix Lancets lancets 1 each by Other route 4 (four) times daily. 100 each 12    Blood Glucose Monitoring Suppl (ACCU-CHEK GUIDE) w/Device KIT 1 Device by Does not apply route 4 (four) times daily. 1 kit 0    Ferric Maltol 30 MG CAPS Take 1 capsule (30 mg total) by mouth every other day. Please take one hour before breakfast or dinner      glucose blood test strip Use as instructed 100 each 12    ondansetron (ZOFRAN-ODT) 4 MG disintegrating tablet Take 1 tablet (4 mg total) by mouth every 6 (six) hours as needed for nausea. 20 tablet 2     I have reviewed patient's Past Medical Hx, Surgical Hx, Family Hx, Social Hx, medications and allergies.   ROS:  Review of Systems Other systems negative  Physical Exam  Patient Vitals for the past 24 hrs:  BP Temp Temp src Pulse Resp SpO2 Height Weight  06/28/23 2018 (!) 108/49 -- -- (!) 118 -- -- -- --  06/28/23 2016 -- 99 F (37.2 C) Oral -- 16 -- -- --  06/28/23 1907 -- (!) 100.4 F (38 C)  Oral -- -- -- -- --  06/28/23 1729 117/68 99.4 F (37.4 C) Oral (!) 125 20 100 % -- --  06/28/23 1723 -- -- -- -- -- -- 5\' 6"  (1.676 m) 59.8 kg   Constitutional: Well-developed, well-nourished female in no acute distress.  Cardiovascular: normal rate and rhythm Respiratory: normal effort, clear to auscultation bilaterally GI: Abd soft, non-tender, gravid appropriate for gestational age.   No rebound or guarding. MS: Extremities nontender, no edema, normal ROM Neurologic: Alert and oriented x 4.  GU: Neg CVAT.  FHT:  Baseline 155, moderate variability, accelerations present, no decelerations Contractions: Uterine irritabiilty   Labs: Results for orders placed or performed during the hospital encounter of 06/28/23 (from the past 24 hours)  Resp panel by RT-PCR (RSV, Flu A&B, Covid) Anterior Nasal Swab     Status: Abnormal   Collection Time: 06/28/23  5:51 PM   Specimen: Anterior Nasal Swab  Result Value Ref Range   SARS Coronavirus  2 by RT PCR POSITIVE (A) NEGATIVE   Influenza A by PCR NEGATIVE NEGATIVE   Influenza B by PCR NEGATIVE NEGATIVE   Resp Syncytial Virus by PCR NEGATIVE NEGATIVE  Urinalysis, Routine w reflex microscopic -Urine, Clean Catch     Status: Abnormal   Collection Time: 06/28/23  6:18 PM  Result Value Ref Range   Color, Urine YELLOW YELLOW   APPearance CLEAR CLEAR   Specific Gravity, Urine 1.016 1.005 - 1.030   pH 6.0 5.0 - 8.0   Glucose, UA NEGATIVE NEGATIVE mg/dL   Hgb urine dipstick NEGATIVE NEGATIVE   Bilirubin Urine NEGATIVE NEGATIVE   Ketones, ur 80 (A) NEGATIVE mg/dL   Protein, ur NEGATIVE NEGATIVE mg/dL   Nitrite NEGATIVE NEGATIVE   Leukocytes,Ua NEGATIVE NEGATIVE   --/--/O NEG (10/28 1705)  Imaging:  [place] MFM OB FOLLOW UP Result Date: 06/02/2023 ----------------------------------------------------------------------  OBSTETRICS REPORT                       (Signed Final 06/02/2023 03:53 pm) ---------------------------------------------------------------------- Patient Info  ID #:       [id number]                          D.O.B.:  06-07-2001 (22 yrs)(F)  Name:       Amy Bond                 Visit Date: 06/02/2023 03:10 pm ---------------------------------------------------------------------- Performed By  Attending:        Ma Rings MD         Ref. Address:     [address]                                                             [address]  Performed By:     Anabel Halon          Secondary Phy.:   [facility] MAU/Triage                    RDMS  Referred By:      [facility]       Location:         [name] ---------------------------------------------------------------------- Orders  #  Description                           Code        Ordered By  1  [place] MFM OB FOLLOW UP                    62952.84    Braxton Feathers ----------------------------------------------------------------------  #  Order #                     Accession #                Episode #  1  [id number]                   [id number]                 [id number] ---------------------------------------------------------------------- Indications  2 vessel umbilical cord                        O69.89X0  Gestational diabetes in pregnancy, diet        O24.410  controlled  Maternal care for fetal tachycardia during     O36.8390  pregnancy  Rh negative state in antepartum                O36.0190  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 Cord Insertion:      Previously seen  Amniotic Fluid  AFI FV:      Within normal limits  AFI Sum(cm)     %Tile       Largest Pocket(cm)  20.45           82          5.69  RUQ(cm)       RLQ(cm)       LUQ(cm)        LLQ(cm)  3.94  5.26          5.69           5.56 ---------------------------------------------------------------------- Biometry  BPD:      72.1  mm     G. Age:  29w 0d         60  %    CI:        77.11   %    70 - 86                                                          FL/HC:      20.5   %    18.8 - 20.6  HC:       260   mm     G. Age:  28w 2d         19  %    HC/AC:      1.09        1.05 - 1.21  AC:      238.8  mm     G. Age:  28w 1d         39  %    FL/BPD:     73.8   %    71 - 87  FL:       53.2  mm     G. Age:  28w 2d         33  %    FL/AC:      22.3   %    20 - 24  LV:        2.6  mm  Est. FW:    1199  gm    2  lb 10 oz      36  % ---------------------------------------------------------------------- OB History  Gravidity:    2         Term:   0        Prem:   0        SAB:   1  TOP:          0       Ectopic:  0        Living: 0 ---------------------------------------------------------------------- Gestational Age  LMP:           29w 4d        Date:  11/07/22                  EDD:   08/14/23  U/S Today:     28w 3d                                        EDD:   08/22/23  Best:          28w 2d     Det. By:  U/S C R L  (01/05/23)    EDD:   08/23/23 ---------------------------------------------------------------------- Targeted Anatomy  Central Nervous System  Calvarium/Cranial V.:  Appears normal         Cereb./Vermis:          Previously seen  Cavum:                 Previously seen  Cisterna Magna:         Previously seen  Lateral Ventricles:    Previously seen        Midline Falx:           Previously seen  Choroid Plexus:        Previously seen  Spine  Cervical:              Previously seen        Sacral:                 Not well visualized  Thoracic:              Previously seen        Shape/Curvature:        Previously seen  Lumbar:                Not well visualized  Head/Neck  Lips:                  Previously seen        Profile:                Previously seen  Neck:                  Previously seen        Orbits/Eyes:            Previously seen  Nuchal Fold:           Previously seen        Mandible:               Previously seen  Nasal Bone:            Previously seen        Maxilla:                Previously seen  Palate:                Previously seen  Thorax  4 Chamber View:        Previously seen        Interventr. Septum:     Previously seen  Cardiac Activity:      Observed               Cardiac Axis:           Previously een  Cardiac Situs:         Previously seen        Diaphragm:              Appears normal  Rt Outflow Tract:      Previously seen        3 Vessel View:          Previously seen  Lt Outflow  Tract:      Previously seen        3 V Trachea View:       Previously seen  Aortic Arch:           Previously seen        IVC:                    Previously seen  Ductal Arch:           Previously seen        Crossing:               Previously seen  SVC:  Previously seen  Abdomen  Ventral Wall:          Previously seen        Lt Kidney:              Appears normal  Cord Insertion:        Previously seen        Rt Kidney:              Appears normal  Situs:                 Previously seen        Bladder:                Appears normal  Stomach:               Appears normal  Extremities  Lt Humerus:            Previously seen        Lt Femur:               Previously seen  Rt Humerus:            Previously seen        Rt Femur:               Previously seen  Lt Forearm:            Previously seen        Lt Lower Leg:           Previously seen  Rt Forearm:            Previously seen        Rt Lower Leg:           Previously seen  Lt Hand:               Previously seen        Lt Foot:                Previously seen  Rt Hand:               Previously seen        Rt Foot:                Previously seen  Other  Umbilical Cord:        Single UA prev         Genitalia:              Previously seen ---------------------------------------------------------------------- Cervix Uterus Adnexa  Cervix  Not visualized (advanced GA >24wks) ---------------------------------------------------------------------- Comments  This patient was seen for a follow up growth scan due to a  fetus with a two-vessel umbilical cord.  She recently failed her 2-hour glucose challenge test.  However, the patient reports that she ate right before taking  the test and will rescreened again soon.  She was informed that the fetal growth and amniotic fluid  level appears appropriate for her gestational age.  A two-vessel umbilical cord continues to be noted on today's  exam.  There were no signs of fetal tachycardia.  A follow-up growth  scan was scheduled in 6 weeks. ----------------------------------------------------------------------                   Ma Rings, MD Electronically Signed Final Report   06/02/2023 03:53 pm ----------------------------------------------------------------------    MAU Course/MDM: I have reviewed the triage vital signs and the nursing notes.   Pertinent labs & imaging results that  were available during my care of the patient were reviewed by me and considered in my medical decision making (see chart for details).      I have reviewed her medical records including past results, notes and treatments.   I have ordered labs and reviewed results.  NST reviewed  Treatments in MAU included respiratory panel swab, UA.    Assessment: 1. Supervision of high risk pregnancy in third trimester   2. Intrauterine synechiae   3. Two vessel umbilical cord, antepartum   4. [redacted] weeks gestation of pregnancy   5. COVID-19 affecting pregnancy in third trimester   COVID 19 infection, non-hypoxic, afebrile after Tylenol.  - Supportive care - Paxlovid Rx sent to preferred pharmacy - Quarantine precautions provided  Plan: Discharge home Return and preterm laabor precautions and fetal kick counts provided Follow up in Office for prenatal visits and recheck  Follow-up Information     [facility] Follow up.   Specialty: Obstetrics and Gynecology Why: As scheduled for routine prenatal care Contact information: [address] [place] [address] [phone]               Pt stable at time of discharge.  Wyn Forster, MD FMOB Fellow, Faculty practice [facility], [facility]  06/28/2023 8:23 PM

## 2023-06-28 NOTE — MAU Note (Signed)
RN called lab to inquire about  respiratory panel . Lab tech, Misty Stanley, answered and informed RN that that they have received sample and will process now.Marland Kitchen

## 2023-06-29 ENCOUNTER — Encounter: Payer: Medicaid Other | Admitting: Obstetrics & Gynecology

## 2023-06-30 NOTE — L&D Delivery Note (Cosign Needed Addendum)
 OB/GYN Faculty Practice Delivery Note  Amy Bond is a 23 y.o. G2P0010 s/p SVD at [redacted]w[redacted]d. She was admitted for IOL for resolved IUGR and 2VC.   ROM: 6h 74m with clear fluid GBS Status: Negative/-- (01/28 1556) Maximum Maternal Temperature: 99.2 F  Labor Progress: Initial SVE: 1/40/-3. Given cytotec x2. Foley balloon out 9:51. Epidural at 10:37. Augmentation with pitocin. AROM with clear fluid at 1519. She then progressed to complete at 21:12.   Delivery Date/Time: 08/18/23 21:36 Delivery: Called to room and patient was feeling urge to push. Found to be complete/+3. Pushed with excellent effort. FHR in 90s-110s during final 5-10 minutes of delivery with good variability and excellent progress. Head delivered. No nuchal cord present. Shoulder and body delivered in usual fashion. Infant with spontaneous cry, placed on mother's abdomen, dried and stimulated. Cord clamped x 2 after 1-minute delay, and cut by FOB. Cord blood drawn. Placenta delivered spontaneously with gentle cord traction. Fundus firm with massage and Pitocin. Labia, perineum, vagina, and cervix inspected and has left labial abrasion which is hemostatic and 1st degree vaginal which was repaired for hemostasis.  Baby Weight: pending  Placenta: 2 vessel cord, intact. Sent to pathology. Complications: None Lacerations: left labial, hemostatic. vaginal - repaired by figure 8 suture with 3-0 monocryl. EBL: 163 mL Analgesia: Epidural   Infant:  APGAR (1 MIN): 8  APGAR (5 MINS): 9  APGAR (10 MINS):    Claria Dice, MD Family Medicine Resident, PGY-1 08/18/2023, 9:52 PM  Attestation of Supervision of Student: I was present, gloved, and participated in the entirety of delivery and third stage. Performed repair.  Joanne Gavel, MD OB Fellow 08/18/2023 10:52 PM

## 2023-07-13 ENCOUNTER — Encounter: Payer: Self-pay | Admitting: Obstetrics & Gynecology

## 2023-07-13 ENCOUNTER — Telehealth (INDEPENDENT_AMBULATORY_CARE_PROVIDER_SITE_OTHER): Payer: Medicaid Other | Admitting: Obstetrics & Gynecology

## 2023-07-13 VITALS — BP 133/87

## 2023-07-13 DIAGNOSIS — O09899 Supervision of other high risk pregnancies, unspecified trimester: Secondary | ICD-10-CM

## 2023-07-13 DIAGNOSIS — D649 Anemia, unspecified: Secondary | ICD-10-CM

## 2023-07-13 DIAGNOSIS — N856 Intrauterine synechiae: Secondary | ICD-10-CM

## 2023-07-13 DIAGNOSIS — O99013 Anemia complicating pregnancy, third trimester: Secondary | ICD-10-CM

## 2023-07-13 DIAGNOSIS — R519 Headache, unspecified: Secondary | ICD-10-CM

## 2023-07-13 DIAGNOSIS — O26893 Other specified pregnancy related conditions, third trimester: Secondary | ICD-10-CM

## 2023-07-13 DIAGNOSIS — Z3A34 34 weeks gestation of pregnancy: Secondary | ICD-10-CM

## 2023-07-13 DIAGNOSIS — O0993 Supervision of high risk pregnancy, unspecified, third trimester: Secondary | ICD-10-CM

## 2023-07-13 NOTE — Progress Notes (Signed)
   OBSTETRICS PRENATAL VIRTUAL VISIT ENCOUNTER NOTE  Provider location: [facility]   Patient location: Home  I connected with Amy Bond on 07/13/23 at  3:30 PM EST by MyChart Video Encounter and verified that I am speaking with the correct person using two identifiers. I discussed the limitations, risks, security and privacy concerns of performing an evaluation and management service virtually and the availability of in person appointments. I also discussed with the patient that there may be a patient responsible charge related to this service. The patient expressed understanding and agreed to proceed. Subjective:  Amy Bond is a 23 y.o. G2P0010 at [redacted]w[redacted]d being seen today for ongoing prenatal care.  She is currently monitored for the following issues for this high-risk pregnancy and has Rh negative status during pregnancy; Supervision of high-risk pregnancy; Anemia of mother in pregnancy, antepartum, third trimester; Thalassemia alpha carrier; Intrauterine synechiae; Two vessel umbilical cord, antepartum; Fetal tachycardia affecting management of mother; and Low ferritin on their problem list.  Patient reports  some headaches over the last week, not helped much after taking one dose of Tylenol  .  Contractions: Not present. Vag. Bleeding: None.  Movement: Present. Denies any leaking of fluid.   The following portions of the patient's history were reviewed and updated as appropriate: allergies, current medications, past family history, past medical history, past social history, past surgical history and problem list.   Objective:   Vitals:   07/13/23 1525  BP: 133/87    Fetal Status:     Movement: Present     General:  Alert, oriented and cooperative. Patient is in no acute distress.  Respiratory: Normal respiratory effort, no problems with respiration noted  Mental Status: Normal mood and affect. Normal behavior. Normal judgment and thought  content.  Rest of physical exam deferred due to type of encounter  Imaging: No results found.  Assessment and Plan:  Pregnancy: G2P0010 at [redacted]w[redacted]d 1. Headache in pregnancy, third trimester (Primary) Advised to take Tylenol  1000 mg po q6h prn headache, keep hydrated. If gets worse, go to MAU. No other concerning signs/symptoms.  2. Two vessel umbilical cord, antepartum 3. Intrauterine synechiae Ultrasound scheduled for tomorrow 1/15, will follow up results and manage accordingly.  4. Anemia of mother in pregnancy, antepartum, third trimester On iron  therapy.  5. [redacted] weeks gestation of pregnancy 6. Supervision of high risk pregnancy in third trimester Preterm labor symptoms and general obstetric precautions including but not limited to vaginal bleeding, contractions, leaking of fluid and fetal movement were reviewed in detail with the patient. I discussed the assessment and treatment plan with the patient. The patient was provided an opportunity to ask questions and all were answered. The patient agreed with the plan and demonstrated an understanding of the instructions. The patient was advised to call back or seek an in-person office evaluation/go to MAU at [facility] for any urgent or concerning symptoms. Please refer to After Visit Summary for other counseling recommendations.   I provided 8 minutes of face-to-face time during this encounter.  Return in about 2 weeks (around 07/27/2023) for Pelvic cultures, OFFICE OB VISIT (MD only).  Future Appointments  Date Time Provider Department Center  07/14/2023  3:30 PM [facility] US[facility]  07/27/2023  3:30 PM Izell Harari, MD [facility]    Gloris Hugger, MD Center for [facility], [facility]

## 2023-07-13 NOTE — Patient Instructions (Signed)
 Return to office for any scheduled appointments. Call the office or go to the MAU at [facility] at [facility] if: You begin to have strong, frequent contractions Your water breaks.  Sometimes it is a big gush of fluid, sometimes it is just a trickle that keeps getting your underwear wet or running down your legs You have vaginal bleeding.  It is normal to have a small amount of spotting if your cervix was checked.  You do not feel your baby moving like normal.  If you do not, get something to eat and drink and lay down and focus on feeling your baby move.   If your baby is still not moving like normal, you should call the office or go to MAU. Any other obstetric concerns.     RSV Vaccination for Pregnant People  CDC recommends two ways to protect babies from getting very sick with Respiratory Syncytial Virus (RSV):  An RSV vaccination given during pregnancy  Pfizer's vaccine Verdis Frederickson) is recommended for use during pregnancy. It is given during RSV season to people who are 32 through [redacted] weeks pregnant.  Or, An RSV immunization given directly to infants and some older babies  Babies born to mothers who get RSV vaccine at least 2 weeks before delivery will have protection and, in most cases, should not need an RSV immunization later.    When is RSV season?  In most regions of the [place] RSV season starts in the fall and peaks in the winter, but the timing and severity of RSV season can vary from place to place and year to year.   The goal of maternal RSV vaccination is to protect babies from getting very sick with RSV during their first RSV season.  In most of the [place], this means maternal RSV vaccine will be given in September through January.  Who should get the maternal RSV vaccine?  People who are 79 through [redacted] weeks pregnant during September through January should get one dose of maternal RSV vaccine to protect their babies. RSV season  can vary around the country.   How is the maternal RSV vaccine administered?  Maternal RSV vaccine is given as a shot into the mother's upper arm. Only a single dose (one shot) of maternal RSV vaccine is recommended.   It is not yet known whether another dose might be needed in later pregnancies.  How well does the maternal RSV vaccine work?  When someone gets RSV vaccine, their body responds by making a protein that protects against the virus that causes RSV. The process takes about 2 weeks. When a pregnant person gets RSV vaccine, their protective proteins (called antibodies) also pass to their baby. So, babies who are born at least 2 weeks after their mother gets RSV vaccine are protected at birth, when infants are at the highest risk of severe RSV disease.   The vaccine can reduce a baby's risk of being hospitalized from RSV by 57% in the first six months after birth.  What are the possible side effects of the maternal RSV vaccine?  In the clinical trials, the side effects most often reported by pregnant people who received the maternal RSV vaccine were pain at the injection site, headache, muscle pain, and nausea.  Although not common, a dangerous high blood pressure condition called pre-eclampsia occurred in 1.8% of pregnant people who received the maternal RSV vaccine compared to 1.4% of pregnant people who received a placebo.  The clinical  trials identified a small increase in the number of preterm births in vaccinated pregnant people. It is not clear if this is a true safety problem related to RSV vaccine or if this occurred for reasons unrelated to vaccination.  To reduce the potential risk of preterm birth and complications from RSV disease, FDA approved the maternal RSV vaccine for use during weeks 32 through 30 of pregnancy while additional studies are conducted.  FDA is requiring the manufacturer to do additional studies that will look more closely at the potential risk of  preterm births and pregnancy-related high blood pressure issues in mothers, including pre-eclampsia.  Severe allergic reactions to vaccines are rare but can happen after any vaccine and can be life-threatening. If you see signs of a severe allergic reaction after vaccination (hives, swelling of the face and throat, difficulty breathing, a fast heartbeat, dizziness, or weakness), seek immediate medical care by calling 911.  As with any medicine or vaccine there is a very remote chance of the vaccine causing other serious injury or death after vaccination.  Adverse events following vaccination should be reported to the Vaccine Adverse Event Reporting System (VAERS), even if it's not clear that the vaccine caused the adverse event. You or your doctor can report an adverse event to [employer] and FDA through VAERS. If you need further assistance reporting to VAERS, please email info@[email or web address] [email or web address] or call [phone].  If you have any questions about side effects from the maternal RSV vaccine, talk with your healthcare provider.  Do I need a prescription for a maternal RSV vaccine?  Until the vaccine available in the office, you will need a prescription to take to a local pharmacy that is providing the vaccine.   How do I pay for the maternal RSV vaccine?  Most private health insurance plans cover the maternal RSV vaccine, but there may be a cost to you depending on your plan.  Contact your insurer to find out.  Medicaid Beginning March 29, 2022, most people with coverage from [facility] and [employer] [facility]) will be guaranteed coverage of all vaccines recommended by the Advisory Committee on Immunization Practice at no cost to them.   Source: [facility] for Immunization and Respiratory Diseases

## 2023-07-14 ENCOUNTER — Ambulatory Visit: Payer: Medicaid Other | Attending: Obstetrics

## 2023-07-14 ENCOUNTER — Ambulatory Visit: Payer: Medicaid Other | Attending: Obstetrics and Gynecology | Admitting: Obstetrics and Gynecology

## 2023-07-14 ENCOUNTER — Other Ambulatory Visit: Payer: Self-pay | Admitting: Obstetrics

## 2023-07-14 DIAGNOSIS — O365930 Maternal care for other known or suspected poor fetal growth, third trimester, not applicable or unspecified: Secondary | ICD-10-CM | POA: Diagnosis not present

## 2023-07-14 DIAGNOSIS — O99013 Anemia complicating pregnancy, third trimester: Secondary | ICD-10-CM

## 2023-07-14 DIAGNOSIS — O360130 Maternal care for anti-D [Rh] antibodies, third trimester, not applicable or unspecified: Secondary | ICD-10-CM

## 2023-07-14 DIAGNOSIS — O09899 Supervision of other high risk pregnancies, unspecified trimester: Secondary | ICD-10-CM | POA: Insufficient documentation

## 2023-07-14 DIAGNOSIS — Z3A34 34 weeks gestation of pregnancy: Secondary | ICD-10-CM | POA: Diagnosis not present

## 2023-07-14 DIAGNOSIS — O358XX0 Maternal care for other (suspected) fetal abnormality and damage, not applicable or unspecified: Secondary | ICD-10-CM | POA: Diagnosis not present

## 2023-07-14 DIAGNOSIS — D563 Thalassemia minor: Secondary | ICD-10-CM

## 2023-07-14 DIAGNOSIS — O368330 Maternal care for abnormalities of the fetal heart rate or rhythm, third trimester, not applicable or unspecified: Secondary | ICD-10-CM

## 2023-07-14 NOTE — Progress Notes (Signed)
 Maternal-Fetal Medicine   Name: Amy Bond DOB: 2001/06/17 MRN: [id number] Referring Provider: Lenoard Rad, MD  I had the pleasure of [name] [facility]. She is G2 P0010 at 34w 2d gestation and is here for fetal growth assessment.  On fetal anatomy scan, single umbilical artery was confirmed. Patient had repeat screening and does not have gestational diabetes. Her blood pressure today at our office is 138/83 mmHg.  Ultrasound The estimated fetal weight is at the 12 percentile and the abdominal circumference measurement is at the 9th percentile.  Amniotic fluid is normal and good fetal activity seen.  Umbilical artery Doppler showed normal forward diastolic flow.  BPP 8/8.  Breech presentation.  Our concerns include: Fetal growth restriction Based on abdominal circumference measurement, she has fetal growth restriction.  This is difficult to differentiate from a constitutionally small fetus.  Most fetuses below the 10th percentile or constitutionally small. Single umbilical artery is associated with fetal growth restriction and some pregnancies. I explained that most common cause of fetal growth restriction is placental insufficiency.  Infection is unlikely in the absence of history of fever or rashes.   Chromosomal anomalies are more common in early onset fetal growth restriction. I explained that gestational hypertension and preeclampsia are more common in first pregnancies, and these could be preceded by fetal growth restriction.  I discussed ultrasound protocol for monitoring fetal growth restriction. Timing of delivery will be based on next fetal growth assessment and abnormal antenatal testing.  Breech presentation.  I reassured her that in most cases spontaneous version is likely with advancing gestation.  Recommendations -Continue weekly BPP and UA Doppler studies. -Fetal growth assessment in 3 weeks and we will discuss timing of  delivery.  Thank you for consultation.  If you have any questions or concerns, please contact me the [facility].  Consultation including face-to-face (more than 50%) counseling 30 minutes.

## 2023-07-23 ENCOUNTER — Inpatient Hospital Stay (HOSPITAL_COMMUNITY)
Admission: AD | Admit: 2023-07-23 | Discharge: 2023-07-23 | Disposition: A | Discharge status: Home / Self Care | Payer: Medicaid Other | Attending: Obstetrics and Gynecology | Admitting: Obstetrics and Gynecology

## 2023-07-23 ENCOUNTER — Encounter (HOSPITAL_COMMUNITY): Payer: Self-pay | Admitting: Obstetrics and Gynecology

## 2023-07-23 DIAGNOSIS — O368130 Decreased fetal movements, third trimester, not applicable or unspecified: Secondary | ICD-10-CM | POA: Diagnosis not present

## 2023-07-23 DIAGNOSIS — Z3A35 35 weeks gestation of pregnancy: Secondary | ICD-10-CM | POA: Insufficient documentation

## 2023-07-23 DIAGNOSIS — O479 False labor, unspecified: Secondary | ICD-10-CM | POA: Diagnosis not present

## 2023-07-23 DIAGNOSIS — O4703 False labor before 37 completed weeks of gestation, third trimester: Secondary | ICD-10-CM | POA: Insufficient documentation

## 2023-07-23 NOTE — Discharge Instructions (Signed)
Reasons to return to MAU at [facility]:  Since you are preterm, return to MAU if:  1.  Contractions are 10 minutes apart or less and they becoming more uncomfortable or painful over time 2.  You have a large gush of fluid, or a trickle of fluid that will not stop and you have to wear a pad 3.  You have bleeding that is bright red, heavier than spotting--like menstrual bleeding (spotting can be normal in early labor or after a check of your cervix) 4.  You do not feel the baby moving like he/she normally does

## 2023-07-23 NOTE — MAU Note (Signed)
..  Amy Bond is a 23 y.o. at [redacted]w[redacted]d here in MAU reporting:  Decreased fetal movement since 11pm. Felt movement when applying monitors.  Top abdominal pain that has been on and off since yesterday, it comes and goes every couple minutes.   Pain score: 4/10 Vitals:   07/23/23 0048 07/23/23 0051  BP:  132/88  Pulse:  83  Resp:  16  Temp:  98.2 F (36.8 C)  SpO2: 99% 100%     FHT:161 Lab orders placed from triage: UA

## 2023-07-23 NOTE — MAU Provider Note (Signed)
Chief Complaint:  Decreased Fetal Movement and Abdominal Pain   Event Date/Time   First Provider Initiated Contact with Patient 07/23/23 0119      HPI: Amy Bond is a 23 y.o. G2P0010 at [redacted]w[redacted]d who presents to maternity admissions reporting decreased fetal movement and irregular cramping starting today.  She denies LOF, vaginal bleeding, vaginal itching/burning, urinary symptoms, h/a, dizziness, n/v, or fever/chills.     HPI  Past Medical History: Past Medical History:  Diagnosis Date   Anemia    Anxiety    UTI (urinary tract infection)     Past obstetric history: OB History  Gravida Para Term Preterm AB Living  2    1   SAB IAB Ectopic Multiple Live Births  1        # Outcome Date GA Lbr Len/2nd Weight Sex Type Anes PTL Lv  2 Current           1 SAB 05/07/22            Past Surgical History: Past Surgical History:  Procedure Laterality Date   NO PAST SURGERIES      Family History: Family History  Problem Relation Age of Onset   Healthy Mother    Other Mother        2021- killed   Healthy Father    Diabetes Paternal Grandmother    Cancer Neg Hx    Asthma Neg Hx    Heart disease Neg Hx    Hypertension Neg Hx     Social History: Social History   Tobacco Use   Smoking status: Never   Smokeless tobacco: Never   Tobacco comments:    Quit vaping May 2024  Vaping Use   Vaping status: Former   Substances: Nicotine, Flavoring  Substance Use Topics   Alcohol use: Never   Drug use: Never    Allergies: No Known Allergies  Meds:  Medications Prior to Admission  Medication Sig Dispense Refill Last Dose/Taking   Accu-Chek Softclix Lancets lancets 1 each by Other route 4 (four) times daily. 100 each 12    aspirin EC 81 MG tablet Take 1 tablet (81 mg total) by mouth at bedtime. Start taking when you are [redacted] weeks pregnant for rest of pregnancy for prevention of preeclampsia 300 tablet 2 Unknown   Blood Glucose Monitoring Suppl (ACCU-CHEK GUIDE) w/Device  KIT 1 Device by Does not apply route 4 (four) times daily. 1 kit 0    Ferric Maltol 30 MG CAPS Take 1 capsule (30 mg total) by mouth every other day. Please take one hour before breakfast or dinner      glucose blood test strip Use as instructed 100 each 12    ondansetron (ZOFRAN-ODT) 4 MG disintegrating tablet Take 1 tablet (4 mg total) by mouth every 6 (six) hours as needed for nausea. 20 tablet 2    Prenatal Vit-Fe Fumarate-FA (PRENATAL MULTIVITAMIN) TABS tablet Take 1 tablet by mouth daily at 12 noon.   Unknown    ROS:  Review of Systems  Constitutional:  Negative for chills, fatigue and fever.  Eyes:  Negative for visual disturbance.  Respiratory:  Negative for shortness of breath.   Cardiovascular:  Negative for chest pain.  Gastrointestinal:  Positive for abdominal pain. Negative for nausea and vomiting.  Genitourinary:  Negative for difficulty urinating, dysuria, flank pain, pelvic pain, vaginal bleeding, vaginal discharge and vaginal pain.  Neurological:  Negative for dizziness and headaches.  Psychiatric/Behavioral: Negative.  I have reviewed patient's Past Medical Hx, Surgical Hx, Family Hx, Social Hx, medications and allergies.   Physical Exam  Patient Vitals for the past 24 hrs:  BP Temp Temp src Pulse Resp SpO2 Height Weight  07/23/23 0239 135/86 -- -- 79 16 -- -- --  07/23/23 0236 -- -- -- 72 -- -- -- --  07/23/23 0235 -- -- -- 70 -- -- -- --  07/23/23 0234 -- -- -- -- -- 99 % -- --  07/23/23 0229 -- -- -- -- -- 100 % -- --  07/23/23 0224 -- -- -- -- -- 100 % -- --  07/23/23 0219 -- -- -- -- -- 100 % -- --  07/23/23 0214 -- -- -- -- -- 100 % -- --  07/23/23 0210 -- -- -- -- -- 100 % -- --  07/23/23 0209 -- -- -- -- -- 100 % -- --  07/23/23 0204 -- -- -- -- -- 100 % -- --  07/23/23 0159 -- -- -- -- -- 100 % -- --  07/23/23 0154 -- -- -- -- -- 100 % -- --  07/23/23 0149 -- -- -- -- -- 100 % -- --  07/23/23 0144 -- -- -- -- -- 99 % -- --  07/23/23 0139 -- --  -- -- -- 100 % -- --  07/23/23 0134 -- -- -- -- -- 100 % -- --  07/23/23 0129 -- -- -- -- -- 100 % -- --  07/23/23 0124 -- -- -- -- -- 100 % -- --  07/23/23 0119 -- -- -- -- -- 100 % -- --  07/23/23 0114 -- -- -- -- -- 100 % -- --  07/23/23 0109 -- -- -- -- -- 100 % -- --  07/23/23 0104 -- -- -- -- -- 100 % -- --  07/23/23 0059 -- -- -- -- -- 100 % -- --  07/23/23 0054 -- -- -- -- -- 100 % -- --  07/23/23 0051 132/88 98.2 F (36.8 C) Oral 83 16 100 % 5\' 6"  (1.676 m) 59.8 kg  07/23/23 0050 (!) 132/92 -- -- 78 -- -- -- --  07/23/23 0048 -- -- -- -- -- 99 % -- --   Constitutional: Well-developed, well-nourished female in no acute distress.  Cardiovascular: normal rate Respiratory: normal effort GI: Abd soft, non-tender, gravid appropriate for gestational age.  MS: Extremities nontender, no edema, normal ROM Neurologic: Alert and oriented x 4.  GU: Neg CVAT.  PELVIC EXAM: deferred  Dilation: Closed Exam by:: Stevenson Windmiller-Leftwhich Kirby,CNM  FHT:  Baseline 135 , moderate variability, accelerations present, no decelerations Contractions: 8-15 minutes, mild to palpation   Labs: No results found for this or any previous visit (from the past 24 hours). --/--/O NEG (10/28 1705)  Imaging:    MAU Course/MDM: Orders Placed This Encounter  Procedures   Discharge patient    No orders of the defined types were placed in this encounter.    NST reviewed and reactive Pt feeling more movement in MAU after drinking some cold fluids Cervix checked at pt request and closed/thick/high, no evidence of PTL Kick counts/PTL s/sx reviewed Keep scheduled appts in the office   Assessment: 1. Decreased fetal movements in third trimester, single or unspecified fetus   2. Braxton Hicks contractions   3. [redacted] weeks gestation of pregnancy     Plan: Discharge home Labor precautions and fetal kick counts  Follow-up Information     [facility]  Follow up.    Specialty: Obstetrics and Gynecology Why: As scheduled Contact information: [address] [place] [address] [phone]        [facility] Follow up.   Specialty: Obstetrics and Gynecology Why: As needed for emergencies Contact information: [address] [place] [address] [phone]               Allergies as of 07/23/2023   No Known Allergies      Medication List     TAKE these medications    Accu-Chek Guide w/Device Kit 1 Device by Does not apply route 4 (four) times daily.   Accu-Chek Softclix Lancets lancets 1 each by Other route 4 (four) times daily.   aspirin EC 81 MG tablet Take 1 tablet (81 mg total) by mouth at bedtime. Start taking when you are [redacted] weeks pregnant for rest of pregnancy for prevention of preeclampsia   Ferric Maltol 30 MG Caps Take 1 capsule (30 mg total) by mouth every other day. Please take one hour before breakfast or dinner   glucose blood test strip Use as instructed   ondansetron 4 MG disintegrating tablet Commonly known as: ZOFRAN-ODT Take 1 tablet (4 mg total) by mouth every 6 (six) hours as needed for nausea.   prenatal multivitamin Tabs tablet Take 1 tablet by mouth daily at 12 noon.        Sharen Counter Certified Nurse-Midwife 07/23/2023 2:39 AM

## 2023-07-26 ENCOUNTER — Other Ambulatory Visit: Payer: Self-pay | Admitting: *Deleted

## 2023-07-26 DIAGNOSIS — O365930 Maternal care for other known or suspected poor fetal growth, third trimester, not applicable or unspecified: Secondary | ICD-10-CM

## 2023-07-27 ENCOUNTER — Other Ambulatory Visit (HOSPITAL_COMMUNITY)
Admission: RE | Admit: 2023-07-27 | Discharge: 2023-07-27 | Disposition: A | Discharge status: Home / Self Care | Payer: Medicaid Other | Source: Ambulatory Visit | Attending: Obstetrics and Gynecology | Admitting: Obstetrics and Gynecology

## 2023-07-27 ENCOUNTER — Ambulatory Visit (INDEPENDENT_AMBULATORY_CARE_PROVIDER_SITE_OTHER): Payer: Medicaid Other | Admitting: Obstetrics and Gynecology

## 2023-07-27 VITALS — BP 131/83 | HR 96 | Wt 135.8 lb

## 2023-07-27 DIAGNOSIS — Z3A36 36 weeks gestation of pregnancy: Secondary | ICD-10-CM | POA: Diagnosis present

## 2023-07-27 DIAGNOSIS — Z113 Encounter for screening for infections with a predominantly sexual mode of transmission: Secondary | ICD-10-CM | POA: Insufficient documentation

## 2023-07-27 DIAGNOSIS — O321XX0 Maternal care for breech presentation, not applicable or unspecified: Secondary | ICD-10-CM

## 2023-07-27 DIAGNOSIS — O99113 Other diseases of the blood and blood-forming organs and certain disorders involving the immune mechanism complicating pregnancy, third trimester: Secondary | ICD-10-CM

## 2023-07-27 DIAGNOSIS — O26893 Other specified pregnancy related conditions, third trimester: Secondary | ICD-10-CM

## 2023-07-27 DIAGNOSIS — R790 Abnormal level of blood mineral: Secondary | ICD-10-CM

## 2023-07-27 DIAGNOSIS — Z6791 Unspecified blood type, Rh negative: Secondary | ICD-10-CM

## 2023-07-27 DIAGNOSIS — O09899 Supervision of other high risk pregnancies, unspecified trimester: Secondary | ICD-10-CM

## 2023-07-27 DIAGNOSIS — D563 Thalassemia minor: Secondary | ICD-10-CM

## 2023-07-27 DIAGNOSIS — O99013 Anemia complicating pregnancy, third trimester: Secondary | ICD-10-CM

## 2023-07-27 DIAGNOSIS — O365930 Maternal care for other known or suspected poor fetal growth, third trimester, not applicable or unspecified: Secondary | ICD-10-CM

## 2023-07-27 DIAGNOSIS — N856 Intrauterine synechiae: Secondary | ICD-10-CM

## 2023-07-27 NOTE — Progress Notes (Unsigned)
ROB: wants cervix check ed

## 2023-07-28 ENCOUNTER — Other Ambulatory Visit: Payer: Self-pay

## 2023-07-28 ENCOUNTER — Ambulatory Visit: Payer: Medicaid Other

## 2023-07-28 ENCOUNTER — Ambulatory Visit: Payer: Medicaid Other | Attending: Obstetrics and Gynecology

## 2023-07-28 ENCOUNTER — Ambulatory Visit (HOSPITAL_BASED_OUTPATIENT_CLINIC_OR_DEPARTMENT_OTHER): Payer: Medicaid Other | Admitting: Maternal & Fetal Medicine

## 2023-07-28 VITALS — BP 132/83 | HR 85

## 2023-07-28 DIAGNOSIS — Z3A36 36 weeks gestation of pregnancy: Secondary | ICD-10-CM

## 2023-07-28 DIAGNOSIS — O0993 Supervision of high risk pregnancy, unspecified, third trimester: Secondary | ICD-10-CM

## 2023-07-28 DIAGNOSIS — O09899 Supervision of other high risk pregnancies, unspecified trimester: Secondary | ICD-10-CM

## 2023-07-28 DIAGNOSIS — N856 Intrauterine synechiae: Secondary | ICD-10-CM

## 2023-07-28 DIAGNOSIS — O358XX0 Maternal care for other (suspected) fetal abnormality and damage, not applicable or unspecified: Secondary | ICD-10-CM

## 2023-07-28 DIAGNOSIS — D563 Thalassemia minor: Secondary | ICD-10-CM

## 2023-07-28 DIAGNOSIS — O321XX0 Maternal care for breech presentation, not applicable or unspecified: Secondary | ICD-10-CM | POA: Insufficient documentation

## 2023-07-28 DIAGNOSIS — O365930 Maternal care for other known or suspected poor fetal growth, third trimester, not applicable or unspecified: Secondary | ICD-10-CM

## 2023-07-28 DIAGNOSIS — O360130 Maternal care for anti-D [Rh] antibodies, third trimester, not applicable or unspecified: Secondary | ICD-10-CM

## 2023-07-28 DIAGNOSIS — O368330 Maternal care for abnormalities of the fetal heart rate or rhythm, third trimester, not applicable or unspecified: Secondary | ICD-10-CM

## 2023-07-28 DIAGNOSIS — O99013 Anemia complicating pregnancy, third trimester: Secondary | ICD-10-CM

## 2023-07-28 NOTE — Progress Notes (Signed)
PRENATAL VISIT NOTE  Subjective:  Amy Bond is a 23 y.o. G2P0010 at [redacted]w[redacted]d being seen today for ongoing prenatal care.  She is currently monitored for the following issues for this high-risk pregnancy and has Rh negative status during pregnancy; Supervision of high-risk pregnancy; Anemia of mother in pregnancy, antepartum, third trimester; Thalassemia alpha carrier; Intrauterine synechiae; Two vessel umbilical cord, antepartum; Fetal tachycardia affecting management of mother; Low ferritin; Breech presentation; and Poor fetal growth affecting management of mother in third trimester on their problem list.  Patient reports no complaints.  Contractions: Irregular. Vag. Bleeding: None.  Movement: Present. Denies leaking of fluid.   The following portions of the patient's history were reviewed and updated as appropriate: allergies, current medications, past family history, past medical history, past social history, past surgical history and problem list.   Objective:   Vitals:   07/27/23 1535  BP: 131/83  Pulse: 96  Weight: 135 lb 12.8 oz (61.6 kg)    Fetal Status: Fetal Heart Rate (bpm): 154   Movement: Present  Presentation: Complete Breech  General:  Alert, oriented and cooperative. Patient is in no acute distress.  Skin: Skin is warm and dry. No rash noted.   Cardiovascular: Normal heart rate noted  Respiratory: Normal respiratory effort, no problems with respiration noted  Abdomen: Soft, gravid, appropriate for gestational age.  Pain/Pressure: Present     Pelvic: Cervical exam performed in the presence of a chaperone Dilation: 1 Effacement (%): Thick Station: Ballotable  Extremities: Normal range of motion.  Edema: None  Mental Status: Normal mood and affect. Normal behavior. Normal judgment and thought content.   Assessment and Plan:  Pregnancy: G2P0010 at [redacted]w[redacted]d 1. [redacted] weeks gestation of pregnancy (Primary) I told her that delivery timing and route will be determined off her  repeat growth u/s on 2/5. I told her that she still feels breech on Leopold's today. I told her that if still breech next week, route of delivery will be d/w her based on delivery timing recommendations from MFM, but I did tentatively d/w her re: ECV, exp management and primary c/s. I also told her that ECV may not be a great idea given borderline weight and intrauterine synechiae.   Trying to eat more encouraged with her - Strep Gp B NAA - Cervicovaginal ancillary only( [place])  2. Intrauterine synechiae  3. Poor fetal growth affecting management of mother in third trimester, single or unspecified fetus 1/15: 12%, 2069gm, ac 9%, afi 12, UA wnl  Continue with week testing for tomorrow  4. Breech presentation, single or unspecified fetus  5. Thalassemia alpha carrier  6. Anemia of mother in pregnancy, antepartum, third trimester  7. Low ferritin S/p venofer 300 x 3, last dose on 11/25    Latest Ref Rng & Units 05/19/2023    5:28 PM 05/18/2023    8:55 AM 04/26/2023    5:06 PM  CBC  WBC 4.0 - 10.5 K/uL 7.3  6.7  7.4   Hemoglobin 12.0 - 15.0 g/dL 9.5  91.4  9.7   Hematocrit 36.0 - 46.0 % 30.9  34.2  31.5   Platelets 150 - 400 K/uL 261  302  242    8. Rh negative status during pregnancy in third trimester Repeat rhogam pp prn  9. Two vessel umbilical cord, antepartum Panorama low risk  Preterm labor symptoms and general obstetric precautions including but not limited to vaginal bleeding, contractions, leaking of fluid and fetal movement were reviewed in detail with  the patient. Please refer to After Visit Summary for other counseling recommendations.   Return in about 5 weeks (around 09/02/2023) for 2/6 or 2/7, in person, md visit, high risk ob.  Future Appointments  Date Time Provider Department Center  08/04/2023  3:15 PM [facility] NURSE [facility]  08/04/2023  3:30 PM [facility] US1 [facility]  08/05/2023  3:30 PM Anyanwu, Jethro Bastos, MD [facility]  08/12/2023  3:15  PM [facility] NURSE [facility]  08/12/2023  3:30 PM [facility] US1 [facility]    [place] Bing, MD

## 2023-07-28 NOTE — Progress Notes (Signed)
Patient information  Patient Name: Amy Bond  Patient MRN:   [id number]  Referring practice: MFM Referring Provider: [facility]  MFM CONSULT  [facility] Amy Bond is a 23 y.o. G2P0010 at [redacted]w[redacted]d here for ultrasound and consultation. Patient Active Problem List   Diagnosis Date Noted   Breech presentation 07/28/2023   Poor fetal growth affecting management of mother in third trimester 07/28/2023   Low ferritin 04/28/2023   Fetal tachycardia affecting management of mother 04/27/2023   Thalassemia alpha carrier 03/29/2023   Intrauterine synechiae 03/29/2023   Two vessel umbilical cord, antepartum 03/29/2023   Anemia of mother in pregnancy, antepartum, third trimester 01/26/2023   Supervision of high-risk pregnancy 01/05/2023   Rh negative status during pregnancy 06/16/2022    [facility] Amy Bond is doing well today with no acute concerns. She denies contractions, bleeding, or loss of fluid and reports good fetal movement.   RE breech presentation: I discussed the possibility of an external cephalic version as well as the potential risks and benefits.  Patient strongly desires to avoid a cesarean delivery.  Fetal growth restriction is not a contraindication to external cephalic version.  Uterine synechiae is a relative contraindication but should not hinder the patient's desire to avoid a C-section as long as she understands the success rate is likely slightly lower.  She also understands that emergency cesarean delivery may be required although this risk is likely less than 1%.  Since the fetal growth has been poor rupture of membranes is not a strong concern especially since she will deliver around 37 to 38 weeks either way.  RE uterine synechiae: This is a relative contraindication to an external cephalic version but if the patient desires the benefit likely outweighs the risk.  The most likely risk of post in this scenario is decreased success rate of  version.  Sonographic findings Single intrauterine pregnancy. Fetal cardiac activity:  Observed and appears normal. Presentation: Breech. Interval fetal anatomy appears normal. Amniotic fluid: Within normal limits.  MVP: 5.1 cm. Placenta: Right lateral. Umbilical artery dopplers findings: -S/D:2.26 which are normal at this gestational age.  -Absent end-diastolic flow: No.  -Reversed end-diastolic flow:  No. BPP 8/8.   There are limitations of prenatal ultrasound such as the inability to detect certain abnormalities due to poor visualization. Various factors such as fetal position, gestational age and maternal body habitus may increase the difficulty in visualizing the fetal anatomy.    Recommendations -Follow-up in 1 week for antenatal testing and umbilical artery Dopplers -Patient will discuss external cephalic version with her OB provider at next visit. -Delivery likely around 37 to 38 weeks  Review of Systems: A review of systems was performed and was negative except per HPI   Vitals and Physical Exam    07/28/2023    3:05 PM 07/27/2023    3:35 PM 07/23/2023    2:39 AM  Vitals with BMI  Weight  135 lbs 13 oz   BMI  21.93   Systolic 132 131 409  Diastolic 83 83 86  Pulse 85 96 79    Sitting comfortably on the sonogram table Nonlabored breathing Normal rate and rhythm Abdomen is nontender  Past pregnancies OB History  Gravida Para Term Preterm AB Living  2    1   SAB IAB Ectopic Multiple Live Births  1        # Outcome Date GA Lbr Len/2nd Weight Sex Type Anes PTL Lv  2 Current  1 SAB 05/07/22            I spent 20 minutes reviewing the patients chart, including labs and images as well as counseling the patient about her medical conditions. Greater than 50% of the time was spent in direct face-to-face patient counseling.  Braxton Feathers  MFM, [facility]   07/28/2023  4:44 PM

## 2023-07-29 LAB — CERVICOVAGINAL ANCILLARY ONLY
Chlamydia: NEGATIVE
Comment: NEGATIVE
Comment: NORMAL
Neisseria Gonorrhea: NEGATIVE

## 2023-07-29 LAB — STREP GP B NAA: Strep Gp B NAA: NEGATIVE

## 2023-07-30 ENCOUNTER — Encounter: Payer: Self-pay | Admitting: Obstetrics and Gynecology

## 2023-07-30 ENCOUNTER — Encounter: Payer: Self-pay | Admitting: Maternal & Fetal Medicine

## 2023-08-02 ENCOUNTER — Encounter: Payer: Self-pay | Admitting: Obstetrics & Gynecology

## 2023-08-04 ENCOUNTER — Ambulatory Visit: Payer: Medicaid Other | Attending: Obstetrics and Gynecology

## 2023-08-04 ENCOUNTER — Ambulatory Visit: Payer: Medicaid Other

## 2023-08-04 VITALS — BP 128/83 | HR 90

## 2023-08-04 DIAGNOSIS — O360130 Maternal care for anti-D [Rh] antibodies, third trimester, not applicable or unspecified: Secondary | ICD-10-CM | POA: Diagnosis not present

## 2023-08-04 DIAGNOSIS — N856 Intrauterine synechiae: Secondary | ICD-10-CM | POA: Diagnosis present

## 2023-08-04 DIAGNOSIS — O09899 Supervision of other high risk pregnancies, unspecified trimester: Secondary | ICD-10-CM | POA: Insufficient documentation

## 2023-08-04 DIAGNOSIS — O0993 Supervision of high risk pregnancy, unspecified, third trimester: Secondary | ICD-10-CM

## 2023-08-04 DIAGNOSIS — O365930 Maternal care for other known or suspected poor fetal growth, third trimester, not applicable or unspecified: Secondary | ICD-10-CM | POA: Diagnosis not present

## 2023-08-04 DIAGNOSIS — D563 Thalassemia minor: Secondary | ICD-10-CM

## 2023-08-04 DIAGNOSIS — O358XX0 Maternal care for other (suspected) fetal abnormality and damage, not applicable or unspecified: Secondary | ICD-10-CM

## 2023-08-04 DIAGNOSIS — O368330 Maternal care for abnormalities of the fetal heart rate or rhythm, third trimester, not applicable or unspecified: Secondary | ICD-10-CM

## 2023-08-04 DIAGNOSIS — O99013 Anemia complicating pregnancy, third trimester: Secondary | ICD-10-CM

## 2023-08-04 DIAGNOSIS — Z3A37 37 weeks gestation of pregnancy: Secondary | ICD-10-CM

## 2023-08-05 ENCOUNTER — Encounter: Payer: Self-pay | Admitting: Obstetrics & Gynecology

## 2023-08-05 ENCOUNTER — Ambulatory Visit: Payer: Medicaid Other | Admitting: Obstetrics & Gynecology

## 2023-08-05 VITALS — BP 116/77 | HR 89 | Wt 136.0 lb

## 2023-08-05 DIAGNOSIS — O99013 Anemia complicating pregnancy, third trimester: Secondary | ICD-10-CM

## 2023-08-05 DIAGNOSIS — Z3A37 37 weeks gestation of pregnancy: Secondary | ICD-10-CM | POA: Diagnosis not present

## 2023-08-05 DIAGNOSIS — N856 Intrauterine synechiae: Secondary | ICD-10-CM

## 2023-08-05 DIAGNOSIS — O09899 Supervision of other high risk pregnancies, unspecified trimester: Secondary | ICD-10-CM | POA: Diagnosis not present

## 2023-08-05 DIAGNOSIS — O0993 Supervision of high risk pregnancy, unspecified, third trimester: Secondary | ICD-10-CM

## 2023-08-05 NOTE — Progress Notes (Signed)
 PRENATAL VISIT NOTE  Subjective:  Amy Bond is a 23 y.o. G2P0010 at [redacted]w[redacted]d being seen today for ongoing prenatal care.  She is currently monitored for the following issues for this high-risk pregnancy and has Rh negative status during pregnancy; Supervision of high-risk pregnancy; Anemia of mother in pregnancy, antepartum, third trimester; Thalassemia alpha carrier; Intrauterine synechiae; Two vessel umbilical cord, antepartum; and Low ferritin on their problem list.  Patient reports no complaints but wants cervical check.  Contractions: Not present. Vag. Bleeding: None.  Movement: Present. Denies leaking of fluid.   The following portions of the patient'Bond history were reviewed and updated as appropriate: allergies, current medications, past family history, past medical history, past social history, past surgical history and problem list.   Objective:   Vitals:   08/05/23 1525  BP: 116/77  Pulse: 89  Weight: 136 lb (61.7 kg)    Fetal Status: Fetal Heart Rate (bpm): 143   Movement: Present  Presentation: Vertex  General:  Alert, oriented and cooperative. Patient is in no acute distress.  Skin: Skin is warm and dry. No rash noted.   Cardiovascular: Normal heart rate noted  Respiratory: Normal respiratory effort, no problems with respiration noted  Abdomen: Soft, gravid, appropriate for gestational age.  Pain/Pressure: Absent     Pelvic: Cervical exam performed in the presence of a chaperone Dilation: 1 Effacement (%): 50 Station: -3  Extremities: Normal range of motion.  Edema: None  Mental Status: Normal mood and affect. Normal behavior. Normal judgment and thought content.   US  MFM OB FOLLOW UP Result Date: 08/04/2023 ----------------------------------------------------------------------  OBSTETRICS REPORT                       (Signed Final 08/04/2023 04:05 pm) ---------------------------------------------------------------------- Patient Info  ID #:       [id number]                           D.O.B.:  2000/07/26 (22 yrs)(F)  Name:       Amy Bond                 Visit Date: 08/04/2023 03:13 pm ---------------------------------------------------------------------- Performed By  Attending:        Delora Smaller DO       Ref. Address:     [address]                                                             [address]  Performed By:     Comer Harrow       Location[name]  Referred By:      [facility] ---------------------------------------------------------------------- Orders  #  Description                           Code        Ordered By  1  US  MFM OB FOLLOW UP                   [id number]    RAVI SHANKAR  2  US  MFM UA CORD DOPPLER                76820.02    RAVI SHANKAR  3  US  MFM FETAL BPP WO NON               76819.01    RAVI SHANKAR     STRESS ----------------------------------------------------------------------  #  Order #                     Accession #                Episode #  1  [id number]                   [id number]                 [id number]  2  [id number]                   [id number]                 [id number]  3  [id number]                   [id number]                 [id number] ---------------------------------------------------------------------- Indications  Maternal care for known or suspected poor      O36.5930  fetal growth, third trimester, single or  unspecified fetus IUGR  2 vessel umbilical cord                        O69.89X0  Maternal care for fetal tachycardia during     O36.8390  pregnancy  Rh negative state in antepartum                O36.0190  Genetic carrier (Alpha Thal CARRIER)           Z14.8  Anemia during pregnancy in third trimester     O99.013  Encounter for other antenatal screening        Z36.2  follow-up  [redacted] weeks  gestation of pregnancy                Z3A.37  LR NIPS - Female, declined AFP ---------------------------------------------------------------------- Fetal Evaluation  Num Of Fetuses:         1  Fetal Heart Rate(bpm):  141  Cardiac Activity:       Observed  Presentation:           Cephalic  Placenta:  Right lateral  P. Cord Insertion:      Previously seen  Amniotic Fluid  AFI FV:      Within normal limits  AFI Sum(cm)     %Tile       Largest Pocket(cm)  10.16           25          3.1  RUQ(cm)       RLQ(cm)       LUQ(cm)        LLQ(cm)  2.13          2.34          2.59           3.1 ---------------------------------------------------------------------- Biophysical Evaluation  Amniotic F.V:   Pocket => 2 cm             F. Tone:        Observed  F. Movement:    Observed                   Score:          8/8  F. Breathing:   Observed ---------------------------------------------------------------------- Biometry  BPD:      87.3  mm     G. Age:  35w 2d         15  %    CI:        73.63   %    70 - 86                                                          FL/HC:      21.5   %    20.8 - 22.6  HC:      323.2  mm     G. Age:  36w 4d         12  %    HC/AC:      1.00        0.92 - 1.05  AC:       322   mm     G. Age:  36w 1d         31  %    FL/BPD:     79.7   %    71 - 87  FL:       69.6  mm     G. Age:  35w 5d         14  %    FL/AC:      21.6   %    20 - 24  Est. FW:    2813  gm      6 lb 3 oz     24  % ---------------------------------------------------------------------- OB History  Gravidity:    2         Term:   0        Prem:   0        SAB:   1  TOP:          0       Ectopic:  0        Living: 0 ---------------------------------------------------------------------- Gestational Age  LMP:           38w 4d        Date:  11/07/22  EDD:   08/14/23  U/Bond Today:     36w 0d                                        EDD:   09/01/23  Best:          37w 2d     Det. By:  U/Bond C R L  (01/05/23)    EDD:    08/23/23 ---------------------------------------------------------------------- Targeted Anatomy  Central Nervous System  Calvarium/Cranial V.:  Appears normal         Cereb./Vermis:          Previously seen  Cavum:                 Previously seen         [place]:         Previously seen  Lateral Ventricles:    Previously seen        Midline Falx:           Previously seen  Choroid Plexus:        Previously seen  Spine  Cervical:              Previously seen        Sacral:                 Limited  Thoracic:              Previously seen        Shape/Curvature:        Previously seen  Lumbar:                Limited  Head/Neck  Lips:                  Previously seen        Profile:                Previously seen  Neck:                  Previously seen        Orbits/Eyes:            Previously seen  Nuchal Fold:           Previously seen        Mandible:               Previously seen  Nasal Bone:            Previously seen        Maxilla:                Previously seen  Palate:                Previously seen  Thorax  4 Chamber View:        Previously seen        Interventr. Septum:     Previously seen  Cardiac Activity:      Observed               Cardiac Axis:           Previously een  Cardiac Situs:         Previously seen        Diaphragm:              Appears normal  Rt Outflow Tract:  Previously seen        3 Vessel View:          Previously seen  Lt Outflow Tract:      Previously seen        3 V Trachea View:       Previously seen  Aortic Arch:           Previously seen        IVC:                    Previously seen  Ductal Arch:           Previously seen        Crossing:               Previously seen  SVC:                   Previously seen  Abdomen  Ventral Wall:          Previously seen        Lt Kidney:              Appears normal  Cord Insertion:        Previously seen        Rt Kidney:              Appears normal  Situs:                 Previously seen        Bladder:                Appears normal   Stomach:               Appears normal  Extremities  Lt Humerus:            Previously seen        Lt Femur:               Previously seen  Rt Humerus:            Previously seen        Rt Femur:               Previously seen  Lt Forearm:            Previously seen        Lt Lower Leg:           Previously seen  Rt Forearm:            Previously seen        Rt Lower Leg:           Previously seen  Lt Hand:               Previously seen        Lt Foot:                Previously seen  Rt Hand:               Previously seen        Rt Foot:                Previously seen  Other  Umbilical Cord:        Single UA prev         Genitalia:              Previously seen ---------------------------------------------------------------------- Doppler - Fetal Vessels  Umbilical Artery   Bond/D     %  tile      RI    %tile      PI    %tile     PSV                                                     (cm/Bond)   2.55       64    0.61       72     0.9       70    79.31 ---------------------------------------------------------------------- Comments  The patient is here for a follow-up ultrasound for FGR at 37w  2d. EDD: 08/23/2023. Dating: U/Bond C R L  (01/05/23). She has  no concerns today.  Sonographic findings  Single intrauterine pregnancy.  Fetal cardiac activity:  Observed and appears normal.  Presentation: Cephalic.  Interval fetal anatomy appears normal.  Fetal biometry shows the estimated fetal weight at the 24  percentile.  Amniotic fluid: Within normal limits.  MVP: 3.1 cm.  Placenta: Right lateral.  Umbilical artery dopplers findings:  -Bond/D:2.55 which are normal at this gestational age.  -Absent end-diastolic flow: no.  -Reversed end-diastolic flow: no.  BPP 8/8.  Recommendations  - Continue weekly antenatal testing until delivery. No need for  UA dopplers since fetal growth is now normal.  - Serial growth US  every 3 weeks until delivery.  - Delivery likely around 39 weeks or sooner if indicated.  There are limitations of prenatal  ultrasound such as the  inability to detect certain abnormalities due to poor  visualization. Various factors such as fetal position,  gestational age and maternal body habitus may increase the  difficulty in visualizing the fetal anatomy. ----------------------------------------------------------------------                 Delora Smaller, DO Electronically Signed Final Report   08/04/2023 04:05 pm ----------------------------------------------------------------------   US  MFM UA CORD DOPPLER Result Date: 08/04/2023 ----------------------------------------------------------------------  OBSTETRICS REPORT                       (Signed Final 08/04/2023 04:05 pm) ---------------------------------------------------------------------- Patient Info  ID #:       [id number]                          D.O.B.:  May 26, 2001 (22 yrs)(F)  Name:       Amy [facility]                 Visit Date: 08/04/2023 03:13 pm ---------------------------------------------------------------------- Performed By  Attending:        Delora Smaller DO       Ref. Address:     [address]                                                             [address]  Performed By:     Comer Harrow       Location[name]  Referred By:      [facility] ---------------------------------------------------------------------- Orders  #  Description                           Code        Ordered By  1  US  MFM OB FOLLOW UP                   [id number]    RAVI SHANKAR  2  US  MFM UA CORD DOPPLER                76820.02    RAVI SHANKAR  3  US  MFM FETAL BPP WO NON               76819.01    RAVI SHANKAR     STRESS ----------------------------------------------------------------------  #  Order #                     Accession #                 Episode #  1  [id number]                   [id number]                 [id number]  2  [id number]                   [id number]                 [id number]  3  [id number]                   [id number]                 [id number] ---------------------------------------------------------------------- Indications  Maternal care for known or suspected poor      O36.5930  fetal growth, third trimester, single or  unspecified fetus IUGR  2 vessel umbilical cord                        O69.89X0  Maternal care for fetal tachycardia during     O36.8390  pregnancy  Rh negative state in antepartum                O36.0190  Genetic carrier (Alpha Thal CARRIER)           Z14.8  Anemia during pregnancy in third trimester     O99.013  Encounter for other antenatal screening        Z36.2  follow-up  [redacted] weeks gestation of pregnancy                Z3A.37  LR NIPS - Female, declined AFP ---------------------------------------------------------------------- Fetal Evaluation  Num Of Fetuses:         1  Fetal Heart Rate(bpm):  141  Cardiac Activity:       Observed  Presentation:           Cephalic  Placenta:               Right lateral  P. Cord Insertion:      Previously seen  Amniotic Fluid  AFI FV:      Within normal limits  AFI Sum(cm)     %Tile       Largest Pocket(cm)  10.16           25          3.1  RUQ(cm)       RLQ(cm)       LUQ(cm)        LLQ(cm)  2.13          2.34          2.59           3.1 ---------------------------------------------------------------------- Biophysical Evaluation  Amniotic F.V:   Pocket => 2 cm             F. Tone:        Observed  F. Movement:    Observed                   Score:          8/8  F. Breathing:   Observed ---------------------------------------------------------------------- Biometry  BPD:      87.3  mm     G. Age:  35w 2d         15  %    CI:        73.63   %    70 - 86                                                          FL/HC:      21.5   %    20.8 - 22.6  HC:      323.2  mm     G. Age:  36w 4d          12  %    HC/AC:      1.00        0.92 - 1.05  AC:       322   mm     G. Age:  36w 1d         31  %    FL/BPD:     79.7   %    71 - 87  FL:       69.6  mm     G. Age:  35w 5d         14  %    FL/AC:      21.6   %    20 - 24  Est. FW:    2813  gm      6 lb 3 oz     24  % ---------------------------------------------------------------------- OB History  Gravidity:    2         Term:   0        Prem:   0  SAB:   1  TOP:          0       Ectopic:  0        Living: 0 ---------------------------------------------------------------------- Gestational Age  LMP:           38w 4d        Date:  11/07/22                  EDD:   08/14/23  U/Bond Today:     36w 0d                                        EDD:   09/01/23  Best:          37w 2d     Det. By:  U/Bond C R L  (01/05/23)    EDD:   08/23/23 ---------------------------------------------------------------------- Targeted Anatomy  Central Nervous System  Calvarium/Cranial V.:  Appears normal         Cereb./Vermis:          Previously seen  Cavum:                 Previously seen        [place]:         Previously seen  Lateral Ventricles:    Previously seen        Midline Falx:           Previously seen  Choroid Plexus:        Previously seen  Spine  Cervical:              Previously seen        Sacral:                 Limited  Thoracic:              Previously seen        Shape/Curvature:        Previously seen  Lumbar:                Limited  Head/Neck  Lips:                  Previously seen        Profile:                Previously seen  Neck:                  Previously seen        Orbits/Eyes:            Previously seen  Nuchal Fold:           Previously seen        Mandible:               Previously seen  Nasal Bone:            Previously seen        Maxilla:                Previously seen  Palate:                Previously seen  Thorax  4 Chamber View:        Previously seen        Interventr. Septum:     Previously seen  Cardiac Activity:  Observed                Cardiac Axis:           Previously een  Cardiac Situs:         Previously seen        Diaphragm:              Appears normal  Rt Outflow Tract:      Previously seen        3 Vessel View:          Previously seen  Lt Outflow Tract:      Previously seen        3 V Trachea View:       Previously seen  Aortic Arch:           Previously seen        IVC:                    Previously seen  Ductal Arch:           Previously seen        Crossing:               Previously seen  SVC:                   Previously seen  Abdomen  Ventral Wall:          Previously seen        Lt Kidney:              Appears normal  Cord Insertion:        Previously seen        Rt Kidney:              Appears normal  Situs:                 Previously seen        Bladder:                Appears normal  Stomach:               Appears normal  Extremities  Lt Humerus:            Previously seen        Lt Femur:               Previously seen  Rt Humerus:            Previously seen        Rt Femur:               Previously seen  Lt Forearm:            Previously seen        Lt Lower Leg:           Previously seen  Rt Forearm:            Previously seen        Rt Lower Leg:           Previously seen  Lt Hand:               Previously seen        Lt Foot:                Previously seen  Rt Hand:               Previously seen  Rt Foot:                Previously seen  Other  Umbilical Cord:        Single UA prev         Genitalia:              Previously seen ---------------------------------------------------------------------- Doppler - Fetal Vessels  Umbilical Artery   Bond/D     %tile      RI    %tile      PI    %tile     PSV                                                     (cm/Bond)   2.55       64    0.61       72     0.9       70    79.31 ---------------------------------------------------------------------- Comments  The patient is here for a follow-up ultrasound for FGR at 37w  2d. EDD: 08/23/2023. Dating: U/Bond C R L  (01/05/23). She has  no  concerns today.  Sonographic findings  Single intrauterine pregnancy.  Fetal cardiac activity:  Observed and appears normal.  Presentation: Cephalic.  Interval fetal anatomy appears normal.  Fetal biometry shows the estimated fetal weight at the 24  percentile.  Amniotic fluid: Within normal limits.  MVP: 3.1 cm.  Placenta: Right lateral.  Umbilical artery dopplers findings:  -Bond/D:2.55 which are normal at this gestational age.  -Absent end-diastolic flow: no.  -Reversed end-diastolic flow: no.  BPP 8/8.  Recommendations  - Continue weekly antenatal testing until delivery. No need for  UA dopplers since fetal growth is now normal.  - Serial growth US  every 3 weeks until delivery.  - Delivery likely around 39 weeks or sooner if indicated.  There are limitations of prenatal ultrasound such as the  inability to detect certain abnormalities due to poor  visualization. Various factors such as fetal position,  gestational age and maternal body habitus may increase the  difficulty in visualizing the fetal anatomy. ----------------------------------------------------------------------                 Delora Smaller, DO Electronically Signed Final Report   08/04/2023 04:05 pm ----------------------------------------------------------------------   US  MFM FETAL BPP WO NON STRESS Result Date: 08/04/2023 ----------------------------------------------------------------------  OBSTETRICS REPORT                       (Signed Final 08/04/2023 04:05 pm) ---------------------------------------------------------------------- Patient Info  ID #:       [id number]                          D.O.B.:  2001/01/23 (22 yrs)(F)  Name:       Amy [facility]                 Visit Date: 08/04/2023 03:13 pm ---------------------------------------------------------------------- Performed By  Attending:        Delora Smaller DO       Ref. Address:     [address]  Performed By:      Comer Harrow       Location:         [name]  Referred By:      [facility] [place] ---------------------------------------------------------------------- Orders  #  Description                           Code        Ordered By  1  US  MFM OB FOLLOW UP                   [id number]    RAVI SHANKAR  2  US  MFM UA CORD DOPPLER                76820.02    RAVI SHANKAR  3  US  MFM FETAL BPP WO NON               76819.01    RAVI [place] [facility]     STRESS ----------------------------------------------------------------------  #  Order #                     Accession #                Episode #  1  [id number]                   [id number]                 [id number]  2  [id number]                   [id number]                 [id number]  3  [id number]                   [id number]                 [id number] ---------------------------------------------------------------------- Indications  Maternal care for known or suspected poor      O36.5930  fetal growth, third trimester, single or  unspecified fetus IUGR  2 vessel umbilical cord                        O69.89X0  Maternal care for fetal tachycardia during     O36.8390  pregnancy  Rh negative state in antepartum  O36.0190  Genetic carrier [employer] CARRIER)           Z14.8  Anemia during pregnancy in third trimester     O99.013  Encounter for other antenatal screening        Z36.2  follow-up  [redacted] weeks gestation of pregnancy                Z3A.37  LR NIPS - Female, declined AFP ---------------------------------------------------------------------- Fetal Evaluation  Num Of Fetuses:         1  Fetal Heart Rate(bpm):  141  Cardiac Activity:       Observed  Presentation:           Cephalic  Placenta:               Right lateral  P. Cord Insertion:      Previously  seen  Amniotic Fluid  AFI FV:      Within normal limits  AFI Sum(cm)     %Tile       Largest Pocket(cm)  10.16           25          3.1  RUQ(cm)       RLQ(cm)       LUQ(cm)        LLQ(cm)  2.13          2.34          2.59           3.1 ---------------------------------------------------------------------- Biophysical Evaluation  Amniotic F.V:   Pocket => 2 cm             F. Tone:        Observed  F. Movement:    Observed                   Score:          8/8  F. Breathing:   Observed ---------------------------------------------------------------------- Biometry  BPD:      87.3  mm     G. Age:  35w 2d         15  %    CI:        73.63   %    70 - 86                                                          FL/HC:      21.5   %    20.8 - 22.6  HC:      323.2  mm     G. Age:  36w 4d         12  %    HC/AC:      1.00        0.92 - 1.05  AC:       322   mm     G. Age:  36w 1d         31  %    FL/BPD:     79.7   %    71 - 87  FL:       69.6  mm     G. Age:  35w 5d         14  %    FL/AC:  21.6   %    20 - 24  Est. FW:    2813  gm      6 lb 3 oz     24  % ---------------------------------------------------------------------- OB History  Gravidity:    2         Term:   0        Prem:   0        SAB:   1  TOP:          0       Ectopic:  0        Living: 0 ---------------------------------------------------------------------- Gestational Age  LMP:           38w 4d        Date:  11/07/22                  EDD:   08/14/23  U/Bond Today:     36w 0d                                        EDD:   09/01/23  Best:          37w 2d     Det. By:  U/Bond C R L  (01/05/23)    EDD:   08/23/23 ---------------------------------------------------------------------- Targeted Anatomy  Central Nervous System  Calvarium/Cranial V.:  Appears normal         Cereb./Vermis:          Previously seen  Cavum:                 Previously seen        [employer]:         Previously seen  Lateral Ventricles:    Previously seen        Midline Falx:            Previously seen  Choroid Plexus:        Previously seen  Spine  Cervical:              Previously seen        Sacral:                 Limited  Thoracic:              Previously seen        Shape/Curvature:        Previously seen  Lumbar:                Limited  Head/Neck  Lips:                  Previously seen        Profile:                Previously seen  Neck:                  Previously seen        Orbits/Eyes:            Previously seen  Nuchal Fold:           Previously seen        Mandible:               Previously seen  Nasal Bone:            Previously seen  Maxilla:                Previously seen  Palate:                Previously seen  Thorax  4 Chamber View:        Previously seen        Interventr. Septum:     Previously seen  Cardiac Activity:      Observed               Cardiac Axis:           Previously een  Cardiac Situs:         Previously seen        Diaphragm:              Appears normal  Rt Outflow Tract:      Previously seen        3 Vessel View:          Previously seen  Lt Outflow Tract:      Previously seen        3 V Trachea View:       Previously seen  Aortic Arch:           Previously seen        IVC:                    Previously seen  Ductal Arch:           Previously seen        Crossing:               Previously seen  SVC:                   Previously seen  Abdomen  Ventral Wall:          Previously seen        Lt Kidney:              Appears normal  Cord Insertion:        Previously seen        Rt Kidney:              Appears normal  Situs:                 Previously seen        Bladder:                Appears normal  Stomach:               Appears normal  Extremities  Lt Humerus:            Previously seen        Lt Femur:               Previously seen  Rt Humerus:            Previously seen        Rt Femur:               Previously seen  Lt Forearm:            Previously seen        Lt Lower Leg:           Previously seen  Rt Forearm:            Previously seen        Rt  Lower Leg:  Previously seen  Lt Hand:               Previously seen        Lt Foot:                Previously seen  Rt Hand:               Previously seen        Rt Foot:                Previously seen  Other  Umbilical Cord:        Single UA prev         Genitalia:              Previously seen ---------------------------------------------------------------------- Doppler - Fetal Vessels  Umbilical Artery   Bond/D     %tile      RI    %tile      PI    %tile     PSV                                                     (cm/Bond)   2.55       64    0.61       72     0.9       70    79.31 ---------------------------------------------------------------------- Comments  The patient is here for a follow-up ultrasound for FGR at 37w  2d. EDD: 08/23/2023. Dating: U/Bond C R L  (01/05/23). She has  no concerns today.  Sonographic findings  Single intrauterine pregnancy.  Fetal cardiac activity:  Observed and appears normal.  Presentation: Cephalic.  Interval fetal anatomy appears normal.  Fetal biometry shows the estimated fetal weight at the 24  percentile.  Amniotic fluid: Within normal limits.  MVP: 3.1 cm.  Placenta: Right lateral.  Umbilical artery dopplers findings:  -Bond/D:2.55 which are normal at this gestational age.  -Absent end-diastolic flow: no.  -Reversed end-diastolic flow: no.  BPP 8/8.  Recommendations  - Continue weekly antenatal testing until delivery. No need for  UA dopplers since fetal growth is now normal.  - Serial growth US  every 3 weeks until delivery.  - Delivery likely around 39 weeks or sooner if indicated.  There are limitations of prenatal ultrasound such as the  inability to detect certain abnormalities due to poor  visualization. Various factors such as fetal position,  gestational age and maternal body habitus may increase the  difficulty in visualizing the fetal anatomy. ----------------------------------------------------------------------                 Delora Smaller, DO Electronically  Signed Final Report   08/04/2023 04:05 pm ----------------------------------------------------------------------   US  MFM UA CORD DOPPLER Result Date: 07/28/2023 ----------------------------------------------------------------------  OBSTETRICS REPORT                       (Signed Final 07/28/2023 04:45 pm) ---------------------------------------------------------------------- Patient Info  ID #:       [id number]                          D.O.B.:  17-Jul-2000 (22 yrs)(F)  Name:       Amy [facility]  Visit Date: 07/28/2023 03:17 pm ---------------------------------------------------------------------- Performed By  Attending:        Delora Smaller DO       Ref. Address:     [address]                                                             [address]  Performed By:     Metta Ar          Secondary Phy.:   [facility] MAU/Triage                    RDMS  Referred By:      [facility]       Location:         [name]                                                             Fetal Care at                                                             [facility] for                                                             Women ---------------------------------------------------------------------- Orders  #  Description                           Code        Ordered By  1  US  MFM UA CORD DOPPLER                76820.02    RAVI SHANKAR  2  US  MFM FETAL BPP WO NON               76819.01    RAVI [facility]     STRESS ----------------------------------------------------------------------  #  Order #                     Accession #                Episode #  1  [id number]                   [id number]                 [id number]  2  [id number]                   [id number]                 [id number] ---------------------------------------------------------------------- Indications  Maternal care for known or suspected poor  O36.5930  fetal growth, third trimester, single or  unspecified fetus IUGR  2 vessel  umbilical cord                        O69.89X0  Maternal care for fetal tachycardia during     O36.8390  pregnancy  Rh negative state in antepartum                O36.0190  Genetic carrier (Alpha Thal CARRIER)           Z14.8  Anemia during pregnancy in third trimester     O99.013  [redacted] weeks gestation of pregnancy                Z3A.36  Encounter for other antenatal screening        Z36.2  follow-up  LR NIPS - Female, declined AFP ---------------------------------------------------------------------- Fetal Evaluation  Num Of Fetuses:         1  Fetal Heart Rate(bpm):  135  Cardiac Activity:       Observed  Presentation:           Breech  Placenta:               Right lateral  P. Cord Insertion:      Previously seen  Amniotic Fluid  AFI FV:      Within normal limits  AFI Sum(cm)     %Tile       Largest Pocket(cm)  6.85            3.2         5.1  RUQ(cm)       RLQ(cm)       LUQ(cm)        LLQ(cm)  5.1           1.75          0              0 ---------------------------------------------------------------------- Biophysical Evaluation  Amniotic F.V:   Pocket => 2 cm             F. Tone:        Observed  F. Movement:    Observed                   Score:          8/8  F. Breathing:   Observed ---------------------------------------------------------------------- OB History  Gravidity:    2         Term:   0        Prem:   0        SAB:   1  TOP:          0       Ectopic:  0        Living: 0 ---------------------------------------------------------------------- Gestational Age  LMP:           37w 4d        Date:  11/07/22                  EDD:   08/14/23  Best:          fontaine 2d     Det. By:  U/Bond C R L  (01/05/23)    EDD:   08/23/23 ---------------------------------------------------------------------- Doppler - Fetal Vessels  Umbilical Artery   Bond/D     %tile      RI    %tile  PI    %tile     PSV    ADFV    RDFV                                                     (cm/Bond)   2.26       41    0.56       46    0.84       55     59.93      No      No ---------------------------------------------------------------------- Comments  MFM CONSULT  Emelly ZORAH BACKES is a 23 y.o. G2P0010 at [redacted]w[redacted]d here for  ultrasound and consultation.  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 She denies contractions, bleeding, or loss of fluid  and reports good fetal movement.  RE breech presentation: I discussed the possibility of an  external cephalic version as well as the potential risks and  benefits.  Patient strongly desires to avoid a cesarean  delivery.  Fetal growth restriction is not a contraindication to  external cephalic version.  Uterine synechiae is a relative  contraindication but should not hinder the patient'Bond desire to  avoid a C-section as long as she understands the success  rate is likely slightly lower.  She also understands that  emergency cesarean delivery may be required although this  risk is likely less than 1%.  Since the fetal growth has been  poor rupture of membranes is not a strong concern especially  since she will deliver around 37 to 38 weeks either way.  RE uterine synechiae: This is a relative contraindication to an  external cephalic version but if the patient desires the benefit  likely outweighs the risk.  The most  likely risk of post in this  scenario is decreased success rate of version.  Sonographic findings  Single intrauterine pregnancy.  Fetal cardiac activity:  Observed and appears normal.  Presentation: Breech.  Interval fetal anatomy appears normal.  Amniotic fluid: Within normal limits.  MVP: 5.1 cm.  Placenta: Right lateral.  Umbilical artery dopplers findings:  -Bond/D:2.26 which are normal at this gestational age.  -Absent end-diastolic flow: No.  -Reversed end-diastolic flow:  No.  BPP 8/8.  There are limitations of prenatal ultrasound such as the  inability to detect certain abnormalities due to poor  visualization. Various factors such as fetal position,  gestational age and maternal body habitus may increase the  difficulty in visualizing the fetal anatomy.  Recommendations  -Follow-up in 1 week for antenatal testing and umbilical artery  Dopplers  -Patient will discuss external cephalic version with her OB  provider at next visit.  -Delivery likely around 37 to 38 weeks ----------------------------------------------------------------------                  Delora Smaller, DO Electronically Signed Final Report   07/28/2023 04:45 pm ----------------------------------------------------------------------   US  MFM FETAL BPP WO NON STRESS Result Date: 07/28/2023 ----------------------------------------------------------------------  OBSTETRICS REPORT                       (Signed Final 07/28/2023 04:45 pm) ---------------------------------------------------------------------- Patient Info  ID #:       [id number]                          D.O.B.:  06/30/2000 (22 yrs)(F)  Name:       [facility]                 Visit Date: 07/28/2023 03:17 pm ---------------------------------------------------------------------- Performed By  Attending:        Delora Smaller DO       Ref. Address:     [address]                                                             [address]  Performed By:     Metta Ar          Secondary  Phy.:   [facility] MAU/Triage                    RDMS  Referred By:      [facility]       Location:         [name]                                                             Fetal Care at                                                             [facility] for  Women ---------------------------------------------------------------------- Orders  #  Description                           Code        Ordered By  1  US  MFM UA CORD DOPPLER                [id number]    RAVI SHANKAR  2  US  MFM FETAL BPP WO NON               76819.01    RAVI SHANKAR     STRESS ----------------------------------------------------------------------  #  Order #                     Accession #                Episode #  1  [id number]                   [id number]                 [id number]  2  [id number]                   [id number]                 [id number] ---------------------------------------------------------------------- Indications  Maternal care for known or suspected poor      O36.5930  fetal growth, third trimester, single or  unspecified fetus IUGR  2 vessel umbilical cord                        O69.89X0  Maternal care for fetal tachycardia during     O36.8390  pregnancy  Rh negative state in antepartum                O36.0190  Genetic carrier (Alpha Thal CARRIER)           Z14.8  Anemia during pregnancy in third trimester     O99.013  [redacted] weeks gestation of pregnancy                Z3A.36  Encounter for other antenatal screening        Z36.2  follow-up  LR NIPS - Female, declined AFP ---------------------------------------------------------------------- Fetal Evaluation  Num Of Fetuses:         1  Fetal Heart Rate(bpm):  135  Cardiac Activity:       Observed  Presentation:           Breech  Placenta:               Right lateral  P. Cord Insertion:      Previously seen  Amniotic Fluid  AFI FV:      Within normal limits  AFI Sum(cm)     %Tile       Largest Pocket(cm)   6.85            3.2         5.1  RUQ(cm)       RLQ(cm)       LUQ(cm)        LLQ(cm)  5.1           1.75          0              0 ---------------------------------------------------------------------- Biophysical Evaluation  Amniotic F.V:   Pocket => 2 cm  F. Tone:        Observed  F. Movement:    Observed                   Score:          8/8  F. Breathing:   Observed ---------------------------------------------------------------------- OB History  Gravidity:    2         Term:   0        Prem:   0        SAB:   1  TOP:          0       Ectopic:  0        Living: 0 ---------------------------------------------------------------------- Gestational Age  LMP:           37w 4d        Date:  11/07/22                  EDD:   08/14/23  Best:          fontaine 2d     Det. By:  U/Bond C R L  (01/05/23)    EDD:   08/23/23 ---------------------------------------------------------------------- Doppler - Fetal Vessels  Umbilical Artery   Bond/D     %tile      RI    %tile      PI    %tile     PSV    ADFV    RDFV                                                     (cm/Bond)   2.26       41    0.56       46    0.84       55    59.93      No      No ---------------------------------------------------------------------- Comments  MFM CONSULT  Samariah MELONI HINZ is a 23 y.o. G2P0010 at [redacted]w[redacted]d here for  ultrasound and consultation.  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 She denies contractions, bleeding, or loss of fluid  and reports good fetal movement.  RE breech presentation: I discussed the possibility of an  external cephalic version as well as the potential risks and  benefits.  Patient strongly desires to avoid a cesarean  delivery.  Fetal growth restriction is not a contraindication to  external cephalic version.  Uterine synechiae is a relative  contraindication but should not hinder the patient'Bond desire to  avoid a C-section as long as she understands the success  rate is likely slightly lower.  She also understands that  emergency cesarean delivery may be required although this  risk is likely less than 1%.  Since the fetal growth has been  poor rupture of membranes is not a strong concern especially  since she will deliver around 37 to 38 weeks either way.  RE uterine synechiae: This is a relative contraindication to an  external cephalic version but if the patient desires the benefit  likely outweighs the risk.  The most likely risk of post in this  scenario is decreased success rate of version.  Sonographic findings  Single intrauterine pregnancy.  Fetal cardiac activity:  Observed and appears normal.  Presentation: Breech.  Interval fetal anatomy appears normal.  Amniotic fluid: Within normal limits.  MVP: 5.1 cm.  Placenta: Right lateral.  Umbilical artery dopplers findings:  -Bond/D:2.26 which are normal at this gestational age.  -Absent end-diastolic flow: No.  -Reversed end-diastolic flow:  No.  BPP 8/8.  There are limitations of prenatal ultrasound such as the  inability to detect certain abnormalities due to poor  visualization. Various factors such as fetal position,  gestational age and maternal body habitus may increase the  difficulty in visualizing the fetal anatomy.  Recommendations  -Follow-up in 1 week for  antenatal testing and umbilical artery  Dopplers  -Patient will discuss external cephalic version with her OB  provider at next visit.  -Delivery likely around 37 to 38 weeks ----------------------------------------------------------------------                  Delora Smaller, DO Electronically Signed Final Report   07/28/2023 04:45 pm ----------------------------------------------------------------------   US  MFM OB FOLLOW UP Result Date: 07/14/2023 ----------------------------------------------------------------------  OBSTETRICS REPORT                       (Signed Final 07/14/2023 04:34 pm) ---------------------------------------------------------------------- Patient Info  ID #:       [id number]                          D.O.B.:  Oct 01, 2000 (22 yrs)(F)  Name:       Amy [facility]                 Visit Date: 07/14/2023 12:30 pm ---------------------------------------------------------------------- Performed By  Attending:        Fredia Fresh MD        Ref. Address:     [address]                                                             [address]  Performed By:     Jonette Nap  Secondary Phy.:   [facility] MAU/Triage                    BS RDMS  Referred By:      [facility]       Location:         [name]                                                             Fetal Care at                                                             [facility] for                                                             Women ---------------------------------------------------------------------- Orders  #  Description                           Code        Ordered By  1  US  MFM OB FOLLOW UP                   [id number]    YU FANG  2  US  MFM UA CORD DOPPLER                76820.02    YU FANG  3  US  MFM FETAL BPP WO NON               76819.01    YU FANG     STRESS ----------------------------------------------------------------------  #  Order #                     Accession #                Episode #  1   [id number]                   [id number]                 [id number]  2  [id number]                   [id number]                 [id number]  3  [id number]                   [id number]                 [id number] ---------------------------------------------------------------------- Indications  Maternal care for known or suspected poor      O36.5930  fetal growth, third trimester, single or  unspecified fetus IUGR  2 vessel umbilical cord  N30.10K9  Maternal care for fetal tachycardia during     O36.8390  pregnancy  Rh negative state in antepartum                O36.0190  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 Cord Insertion:      Previously seen  Amniotic Fluid  AFI FV:      Within normal limits  AFI Sum(cm)     %Tile       Largest Pocket(cm)  12.23           36          4.74  RUQ(cm)       RLQ(cm)       LUQ(cm)        LLQ(cm)  2.46          4.74          2.95           2.08 ---------------------------------------------------------------------- Biophysical Evaluation  Amniotic F.V:   Pocket => 2 cm             F. Tone:        Observed  F. Movement:    Observed                   Score:          8/8  F. Breathing:   Observed ---------------------------------------------------------------------- Biometry  BPD:      84.2  mm     G. Age:  33w 6d         37  %    CI:        74.85   %    70 - 86                                                          FL/HC:      20.6   %    19.4 - 21.8  HC:      308.8  mm     G. Age:  34w 3d         20  %     HC/AC:      1.09        0.96 - 1.11  AC:      283.3  mm     G. Age:  32w 3d          9  %    FL/BPD:     75.7   %    71 - 87  FL:  63.7  mm     G. Age:  32w 6d         11  %    FL/AC:      22.5   %    20 - 24  LV:        3.5  mm  Est. FW:    2069  gm      4 lb 9 oz     12  % ---------------------------------------------------------------------- OB History  Gravidity:    2         Term:   0        Prem:   0        SAB:   1  TOP:          0       Ectopic:  0        Living: 0 ---------------------------------------------------------------------- Gestational Age  LMP:           35w 4d        Date:  11/07/22                  EDD:   08/14/23  U/Bond Today:     33w 3d                                        EDD:   08/29/23  Best:          34w 2d     Det. By:  U/Bond C R L  (01/05/23)    EDD:   08/23/23 ---------------------------------------------------------------------- Targeted Anatomy  Central Nervous System  Calvarium/Cranial V.:  Appears normal         Cereb./Vermis:          Previously seen  Cavum:                 Previously seen        Sales Executive:         Previously seen  Lateral Ventricles:    Previously seen        Midline Falx:           Previously seen  Choroid Plexus:        Previously seen  Spine  Cervical:              Previously seen        Sacral:                 Not well visualized  Thoracic:              Previously seen        Shape/Curvature:        Previously seen  Lumbar:                Not well visualized  Head/Neck  Lips:                  Previously seen        Profile:                Previously seen  Neck:                  Previously seen        Orbits/Eyes:            Previously seen  Nuchal Fold:  Previously seen        Mandible:               Previously seen  Nasal Bone:            Previously seen        Maxilla:                Previously seen  Palate:                Previously seen  Thorax  4 Chamber View:        Previously seen        Interventr. Septum:     Previously seen  Cardiac  Activity:      Observed               Cardiac Axis:           Previously een  Cardiac Situs:         Previously seen        Diaphragm:              Appears normal  Rt Outflow Tract:      Previously seen        3 Vessel View:          Previously seen  Lt Outflow Tract:      Previously seen        3 V Trachea View:       Previously seen  Aortic Arch:           Previously seen        IVC:                    Previously seen  Ductal Arch:           Previously seen        Crossing:               Previously seen  SVC:                   Previously seen  Abdomen  Ventral Wall:          Previously seen        Lt Kidney:              Appears normal  Cord Insertion:        Previously seen        Rt Kidney:              Appears normal  Situs:                 Previously seen        Bladder:                Appears normal  Stomach:               Appears normal  Extremities  Lt Humerus:            Previously seen        Lt Femur:               Previously seen  Rt Humerus:            Previously seen        Rt Femur:               Previously seen  Lt Forearm:            Previously seen  Lt Lower Leg:           Previously seen  Rt Forearm:            Previously seen        Rt Lower Leg:           Previously seen  Lt Hand:               Previously seen        Lt Foot:                Previously seen  Rt Hand:               Previously seen        Rt Foot:                Previously seen  Other  Umbilical Cord:        Single UA prev         Genitalia:              Previously seen ---------------------------------------------------------------------- Doppler - Fetal Vessels  Umbilical Artery   Bond/D     %tile      RI    %tile      PI    %tile     PSV    ADFV    RDFV                                                     (cm/Bond)   2.56       53    0.61       60    0.87       53    51.96      No      No ---------------------------------------------------------------------- Impression  The estimated fetal weight is at the 12 percentile and the   abdominal circumference measurement is at the 9th  percentile.  Amniotic fluid is normal and good fetal activity  seen.  Umbilical artery Doppler showed normal forward  diastolic flow.  BPP 8/8.  Breech presentation.  xxxxxxxxxxxxxxxxxxxxxxxxxxxxxxxxxxxxxxxxxxxxxxxx  Consultation  I had the pleasure of seeing [facility]. Fierro [facility]. She is G2 P0010 at 34w 2d gestation  and is here for fetal growth assessment.  On fetal anatomy scan, single umbilical artery was confirmed.  Patient had repeat screening and does not have gestational  diabetes.  Her blood pressure today at our office is 138/83 mmHg.  Our concerns include:  Fetal growth restriction  Based on abdominal circumference measurement, she has  fetal growth restriction.  This is difficult to differentiate from a  constitutionally small fetus.  Most fetuses below the 10th  percentile or constitutionally small.  Single umbilical artery is associated with fetal growth  restriction and some pregnancies.  I explained that most common cause of fetal growth  restriction is placental insufficiency.  Infection is unlikely in  the absence of history of fever or rashes.  Chromosomal anomalies are more common in early onset  fetal growth restriction.  I explained that gestational hypertension and preeclampsia  are more common in first pregnancies, and these could be  preceded by fetal growth restriction.  I discussed ultrasound protocol for monitoring fetal growth  restriction.  Timing of delivery will be based on next fetal  growth  assessment and abnormal antenatal testing.  Breech presentation.  I reassured her that in most cases  spontaneous version is likely with advancing gestation. ---------------------------------------------------------------------- Recommendations  -Continue weekly BPP and UA Doppler studies.  -Fetal growth assessment in 3 weeks and we will discuss  timing of delivery.  ----------------------------------------------------------------------                 Fredia Fresh, MD Electronically Signed Final Report   07/14/2023 04:34 pm ----------------------------------------------------------------------   US  MFM UA CORD DOPPLER Result Date: 07/14/2023 ----------------------------------------------------------------------  OBSTETRICS REPORT                       (Signed Final 07/14/2023 04:34 pm) ---------------------------------------------------------------------- Patient Info  ID #:       [id number]                          D.O.B.:  April 29, 2001 (22 yrs)(F)  Name:       [facility]                 Visit Date: 07/14/2023 12:30 pm ---------------------------------------------------------------------- Performed By  Attending:        Fredia Fresh MD        Ref. Address:     [address]                                                             [address]  Performed By:     Jonette Nap        Secondary Phy.:   [facility]/Triage                    BS RDMS  Referred By:      [facility]       Location:         [name] ---------------------------------------------------------------------- Orders  #  Description                           Code        Ordered By  1  US  MFM OB FOLLOW UP  23183.98    YU FANG  2  US  MFM UA CORD DOPPLER                76820.02    YU FANG  3  US  MFM FETAL BPP WO NON               76819.01    YU FANG     STRESS ----------------------------------------------------------------------  #  Order #                     Accession #                Episode #  1  [id number]                   [id number]                 [id number]  2  [id number]                   [id number]                 [id number]  3  [id number]                    [id number]                 [id number] ---------------------------------------------------------------------- Indications  Maternal care for known or suspected poor      O36.5930  fetal growth, third trimester, single or  unspecified fetus IUGR  2 vessel umbilical cord                        O69.89X0  Maternal care for fetal tachycardia during     O36.8390  pregnancy  Rh negative state in antepartum                O36.0190  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 Cord Insertion:      Previously seen  Amniotic Fluid  AFI FV:      Within normal limits  AFI Sum(cm)     %Tile       Largest Pocket(cm)  12.23           36          4.74  RUQ(cm)       RLQ(cm)       LUQ(cm)        LLQ(cm)  2.46          4.74          2.95           2.08 ---------------------------------------------------------------------- Biophysical Evaluation  Amniotic F.V:   Pocket => 2 cm  F. Tone:        Observed  F. Movement:    Observed                   Score:          8/8  F. Breathing:   Observed ---------------------------------------------------------------------- Biometry  BPD:      84.2  mm     G. Age:  33w 6d         37  %    CI:        74.85   %    70 - 86                                                          FL/HC:      20.6   %    19.4 - 21.8  HC:      308.8  mm     G. Age:  34w 3d         20  %    HC/AC:      1.09        0.96 - 1.11  AC:      283.3  mm     G. Age:  32w 3d          9  %    FL/BPD:     75.7   %    71 - 87  FL:       63.7  mm     G. Age:  32w 6d          11  %    FL/AC:      22.5   %    20 - 24  LV:        3.5  mm  Est. FW:    2069  gm      4 lb 9 oz     12  % ---------------------------------------------------------------------- OB History  Gravidity:    2         Term:   0        Prem:   0        SAB:   1  TOP:          0       Ectopic:  0        Living: 0 ---------------------------------------------------------------------- Gestational Age  LMP:           35w 4d        Date:  11/07/22                  EDD:   08/14/23  U/Bond Today:     33w 3d                                        EDD:   08/29/23  Best:          34w 2d     Det. By:  U/Bond C R L  (01/05/23)    EDD:   08/23/23 ---------------------------------------------------------------------- Targeted Anatomy  Central Nervous System  Calvarium/Cranial V.:  Appears normal         Cereb./Vermis:  Previously seen  Cavum:                 Previously seen        [place]:         Previously seen  Lateral Ventricles:    Previously seen        Midline Falx:           Previously seen  Choroid Plexus:        Previously seen  Spine  Cervical:              Previously seen        Sacral:                 Not well visualized  Thoracic:              Previously seen        Shape/Curvature:        Previously seen  Lumbar:                Not well visualized  Head/Neck  Lips:                  Previously seen        Profile:                Previously seen  Neck:                  Previously seen        Orbits/Eyes:            Previously seen  Nuchal Fold:           Previously seen        Mandible:               Previously seen  Nasal Bone:            Previously seen        Maxilla:                Previously seen  Palate:                Previously seen  Thorax  4 Chamber View:        Previously seen        Interventr. Septum:     Previously seen  Cardiac Activity:      Observed               Cardiac Axis:           Previously een  Cardiac Situs:         Previously seen        Diaphragm:              Appears normal  Rt  Outflow Tract:      Previously seen        3 Vessel View:          Previously seen  Lt Outflow Tract:      Previously seen        3 V Trachea View:       Previously seen  Aortic Arch:           Previously seen        IVC:                    Previously seen  Ductal Arch:           Previously seen  Crossing:               Previously seen  SVC:                   Previously seen  Abdomen  Ventral Wall:          Previously seen        Lt Kidney:              Appears normal  Cord Insertion:        Previously seen        Rt Kidney:              Appears normal  Situs:                 Previously seen        Bladder:                Appears normal  Stomach:               Appears normal  Extremities  Lt Humerus:            Previously seen        Lt Femur:               Previously seen  Rt Humerus:            Previously seen        Rt Femur:               Previously seen  Lt Forearm:            Previously seen        Lt Lower Leg:           Previously seen  Rt Forearm:            Previously seen        Rt Lower Leg:           Previously seen  Lt Hand:               Previously seen        Lt Foot:                Previously seen  Rt Hand:               Previously seen        Rt Foot:                Previously seen  Other  Umbilical Cord:        Single UA prev         Genitalia:              Previously seen ---------------------------------------------------------------------- Doppler - Fetal Vessels  Umbilical Artery   Bond/D     %tile      RI    %tile      PI    %tile     PSV    ADFV    RDFV                                                     (cm/Bond)   2.56       53    0.61       60    0.87  53    51.96      No      No ---------------------------------------------------------------------- Impression  The estimated fetal weight is at the 12 percentile and the  abdominal circumference measurement is at the 9th  percentile.  Amniotic fluid is normal and good fetal activity  seen.  Umbilical artery Doppler showed normal  forward  diastolic flow.  BPP 8/8.  Breech presentation.  xxxxxxxxxxxxxxxxxxxxxxxxxxxxxxxxxxxxxxxxxxxxxxxx  Consultation  I had the pleasure of seeing Ms. Treu [facility] at [facility]. She is G2 P0010 at 34w 2d gestation  and is here for fetal growth assessment.  On fetal anatomy scan, single umbilical artery was confirmed.  Patient had repeat screening and does not have gestational  diabetes.  Her blood pressure today at our office is 138/83 mmHg.  Our concerns include:  Fetal growth restriction  Based on abdominal circumference measurement, she has  fetal growth restriction.  This is difficult to differentiate from a  constitutionally small fetus.  Most fetuses below the 10th  percentile or constitutionally small.  Single umbilical artery is associated with fetal growth  restriction and some pregnancies.  I explained that most common cause of fetal growth  restriction is placental insufficiency.  Infection is unlikely in  the absence of history of fever or rashes.  Chromosomal anomalies are more common in early onset  fetal growth restriction.  I explained that gestational hypertension and preeclampsia  are more common in first pregnancies, and these could be  preceded by fetal growth restriction.  I discussed ultrasound protocol for monitoring fetal growth  restriction.  Timing of delivery will be based on next fetal growth  assessment and abnormal antenatal testing.  Breech presentation.  I reassured her that in most cases  spontaneous version is likely with advancing gestation. ---------------------------------------------------------------------- Recommendations  -Continue weekly BPP and UA Doppler studies.  -Fetal growth assessment in 3 weeks and we will discuss  timing of delivery. ----------------------------------------------------------------------                 Fredia Fresh, MD Electronically Signed Final Report   07/14/2023 04:34 pm  ----------------------------------------------------------------------   US  MFM FETAL BPP WO NON STRESS Result Date: 07/14/2023 ----------------------------------------------------------------------  OBSTETRICS REPORT                       (Signed Final 07/14/2023 04:34 pm) ---------------------------------------------------------------------- Patient Info  ID #:       [id number]                          D.O.B.:  11/25/2000 (22 yrs)(F)  Name:       [facility]                 Visit Date: 07/14/2023 12:30 pm ---------------------------------------------------------------------- Performed By  Attending:        Fredia Fresh MD        Ref. Address:     [address]                                                             [address]  Performed By:     Jonette Nap        Secondary Phy.:   [place] [facility] MAU/Triage  BS RDMS  Referred By:      [facility]       Location:         [name] at                                                             [facility]                                                             [facility] ---------------------------------------------------------------------- Orders  #  Description                           Code        Ordered By  1  US  MFM OB FOLLOW UP                   [id number]    YU FANG  2  US  MFM UA CORD DOPPLER                76820.02    YU FANG  3  US  MFM FETAL BPP WO NON               76819.01    YU FANG     STRESS ----------------------------------------------------------------------  #  Order #                     Accession #                Episode #  1  [id number]                   [id number]                 [id number]  2  [id number]                   [id number]                 [id number]  3  [id number]                   [id number]                 [id number] ---------------------------------------------------------------------- Indications  Maternal care for known or suspected  poor      O36.5930  fetal growth, third trimester, single or  unspecified fetus IUGR  2 vessel umbilical cord                        O69.89X0  Maternal care for fetal tachycardia during     O36.8390  pregnancy  Rh negative state in antepartum  O36.0190  Genetic carrier [employer] CARRIER)           Z14.8  Anemia during pregnancy in third trimester     O99.013  Encounter for other antenatal screening        Z36.2  follow-up  LR NIPS - Female, declined AFP  [redacted] weeks gestation of pregnancy                Z3A.34 ---------------------------------------------------------------------- Fetal Evaluation  Num Of Fetuses:         1  Fetal Heart Rate(bpm):  140  Cardiac Activity:       Observed  Presentation:           Breech  Placenta:               Right lateral  P. Cord Insertion:      Previously seen  Amniotic Fluid  AFI FV:      Within normal limits  AFI Sum(cm)     %Tile       Largest Pocket(cm)  12.23           36          4.74  RUQ(cm)       RLQ(cm)       LUQ(cm)        LLQ(cm)  2.46          4.74          2.95           2.08 ---------------------------------------------------------------------- Biophysical Evaluation  Amniotic F.V:   Pocket => 2 cm             F. Tone:        Observed  F. Movement:    Observed                   Score:          8/8  F. Breathing:   Observed ---------------------------------------------------------------------- Biometry  BPD:      84.2  mm     G. Age:  33w 6d         37  %    CI:        74.85   %    70 - 86                                                          FL/HC:      20.6   %    19.4 - 21.8  HC:      308.8  mm     G. Age:  34w 3d         20  %    HC/AC:      1.09        0.96 - 1.11  AC:      283.3  mm     G. Age:  32w 3d          9  %    FL/BPD:     75.7   %    71 - 87  FL:       63.7  mm     G. Age:  32w 6d         11  %    FL/AC:  22.5   %    20 - 24  LV:        3.5  mm  Est. FW:    2069  gm      4 lb 9 oz     12  %  ---------------------------------------------------------------------- OB History  Gravidity:    2         Term:   0        Prem:   0        SAB:   1  TOP:          0       Ectopic:  0        Living: 0 ---------------------------------------------------------------------- Gestational Age  LMP:           35w 4d        Date:  11/07/22                  EDD:   08/14/23  U/Bond Today:     33w 3d                                        EDD:   08/29/23  Best:          34w 2d     Det. By:  U/Bond C R L  (01/05/23)    EDD:   08/23/23 ---------------------------------------------------------------------- Targeted Anatomy  Central Nervous System  Calvarium/Cranial V.:  Appears normal         Cereb./Vermis:          Previously seen  Cavum:                 Previously seen        [employer]:         Previously seen  Lateral Ventricles:    Previously seen        Midline Falx:           Previously seen  Choroid Plexus:        Previously seen  Spine  Cervical:              Previously seen        Sacral:                 Not well visualized  Thoracic:              Previously seen        Shape/Curvature:        Previously seen  Lumbar:                Not well visualized  Head/Neck  Lips:                  Previously seen        Profile:                Previously seen  Neck:                  Previously seen        Orbits/Eyes:            Previously seen  Nuchal Fold:           Previously seen        Mandible:               Previously seen  Nasal Bone:  Previously seen        Maxilla:                Previously seen  Palate:                Previously seen  Thorax  4 Chamber View:        Previously seen        Interventr. Septum:     Previously seen  Cardiac Activity:      Observed               Cardiac Axis:           Previously een  Cardiac Situs:         Previously seen        Diaphragm:              Appears normal  Rt Outflow Tract:      Previously seen        3 Vessel View:          Previously seen  Lt Outflow Tract:      Previously  seen        3 V Trachea View:       Previously seen  Aortic Arch:           Previously seen        IVC:                    Previously seen  Ductal Arch:           Previously seen        Crossing:               Previously seen  SVC:                   Previously seen  Abdomen  Ventral Wall:          Previously seen        Lt Kidney:              Appears normal  Cord Insertion:        Previously seen        Rt Kidney:              Appears normal  Situs:                 Previously seen        Bladder:                Appears normal  Stomach:               Appears normal  Extremities  Lt Humerus:            Previously seen        Lt Femur:               Previously seen  Rt Humerus:            Previously seen        Rt Femur:               Previously seen  Lt Forearm:            Previously seen        Lt Lower Leg:           Previously seen  Rt Forearm:            Previously seen  Rt Lower Leg:           Previously seen  Lt Hand:               Previously seen        Lt Foot:                Previously seen  Rt Hand:               Previously seen        Rt Foot:                Previously seen  Other  Umbilical Cord:        Single UA prev         Genitalia:              Previously seen ---------------------------------------------------------------------- Doppler - Fetal Vessels  Umbilical Artery   Bond/D     %tile      RI    %tile      PI    %tile     PSV    ADFV    RDFV                                                     (cm/Bond)   2.56       53    0.61       60    0.87       53    51.96      No      No ---------------------------------------------------------------------- Impression  The estimated fetal weight is at the 12 percentile and the  abdominal circumference measurement is at the 9th  percentile.  Amniotic fluid is normal and good fetal activity  seen.  Umbilical artery Doppler showed normal forward  diastolic flow.  BPP 8/8.  Breech presentation.  xxxxxxxxxxxxxxxxxxxxxxxxxxxxxxxxxxxxxxxxxxxxxxxx  Consultation  I  had the pleasure of seeing Ms. Chirico today at [facility]. She is G2 P0010 at 34w 2d gestation  and is here for fetal growth assessment.  On fetal anatomy scan, single umbilical artery was confirmed.  Patient had repeat screening and does not have gestational  diabetes.  Her blood pressure today at our office is 138/83 mmHg.  Our concerns include:  Fetal growth restriction  Based on abdominal circumference measurement, she has  fetal growth restriction.  This is difficult to differentiate from a  constitutionally small fetus.  Most fetuses below the 10th  percentile or constitutionally small.  Single umbilical artery is associated with fetal growth  restriction and some pregnancies.  I explained that most common cause of fetal growth  restriction is placental insufficiency.  Infection is unlikely in  the absence of history of fever or rashes.  Chromosomal anomalies are more common in early onset  fetal growth restriction.  I explained that gestational hypertension and preeclampsia  are more common in first pregnancies, and these could be  preceded by fetal growth restriction.  I discussed ultrasound protocol for monitoring fetal growth  restriction.  Timing of delivery will be based on next fetal growth  assessment and abnormal antenatal testing.  Breech presentation.  I reassured her that in most cases  spontaneous version is likely with advancing gestation. ---------------------------------------------------------------------- Recommendations  -Continue weekly BPP and UA Doppler studies.  -Fetal  growth assessment in 3 weeks and we will discuss  timing of delivery. ----------------------------------------------------------------------                 Fredia Fresh, MD Electronically Signed Final Report   07/14/2023 04:34 pm ----------------------------------------------------------------------    Assessment and Plan:  Pregnancy: G2P0010 at [redacted]w[redacted]d 1. Two vessel umbilical cord, antepartum  (Primary) 2. Intrauterine synechiae Follow up scans as per MFM.  3. Anemia of mother in pregnancy, antepartum, third trimester On iron  therapy.  4. [redacted] weeks gestation of pregnancy 5. Supervision of high risk pregnancy in third trimester No other concerns. Labor symptoms and general obstetric precautions including but not limited to vaginal bleeding, contractions, leaking of fluid and fetal movement were reviewed in detail with the patient. Please refer to After Visit Summary for other counseling recommendations.   Return in about 1 week (around 08/12/2023) for OFFICE OB VISIT (MD only).  Future Appointments  Date Time Provider Department [facility]  08/12/2023  3:15 PM [facility] NURSE [place] [place] [facility] [place]  08/12/2023  3:30 PM [facility] US[facility]    Gloris Hugger, MD

## 2023-08-05 NOTE — Patient Instructions (Signed)

## 2023-08-11 ENCOUNTER — Inpatient Hospital Stay (HOSPITAL_COMMUNITY)
Admission: AD | Admit: 2023-08-11 | Discharge: 2023-08-11 | Disposition: A | Discharge status: Home / Self Care | Payer: Medicaid Other | Attending: Obstetrics and Gynecology | Admitting: Obstetrics and Gynecology

## 2023-08-11 ENCOUNTER — Encounter (HOSPITAL_COMMUNITY): Payer: Self-pay | Admitting: Obstetrics and Gynecology

## 2023-08-11 DIAGNOSIS — Z3689 Encounter for other specified antenatal screening: Secondary | ICD-10-CM | POA: Diagnosis not present

## 2023-08-11 DIAGNOSIS — Z3A38 38 weeks gestation of pregnancy: Secondary | ICD-10-CM

## 2023-08-11 DIAGNOSIS — O99613 Diseases of the digestive system complicating pregnancy, third trimester: Secondary | ICD-10-CM | POA: Diagnosis not present

## 2023-08-11 DIAGNOSIS — O471 False labor at or after 37 completed weeks of gestation: Secondary | ICD-10-CM | POA: Diagnosis present

## 2023-08-11 DIAGNOSIS — O98513 Other viral diseases complicating pregnancy, third trimester: Secondary | ICD-10-CM | POA: Diagnosis not present

## 2023-08-11 DIAGNOSIS — O99283 Endocrine, nutritional and metabolic diseases complicating pregnancy, third trimester: Secondary | ICD-10-CM | POA: Insufficient documentation

## 2023-08-11 DIAGNOSIS — R102 Pelvic and perineal pain: Secondary | ICD-10-CM | POA: Diagnosis not present

## 2023-08-11 DIAGNOSIS — O479 False labor, unspecified: Secondary | ICD-10-CM | POA: Diagnosis not present

## 2023-08-11 DIAGNOSIS — E860 Dehydration: Secondary | ICD-10-CM

## 2023-08-11 DIAGNOSIS — R109 Unspecified abdominal pain: Secondary | ICD-10-CM | POA: Insufficient documentation

## 2023-08-11 DIAGNOSIS — R197 Diarrhea, unspecified: Secondary | ICD-10-CM | POA: Insufficient documentation

## 2023-08-11 DIAGNOSIS — O26893 Other specified pregnancy related conditions, third trimester: Secondary | ICD-10-CM | POA: Diagnosis not present

## 2023-08-11 DIAGNOSIS — A084 Viral intestinal infection, unspecified: Secondary | ICD-10-CM | POA: Diagnosis not present

## 2023-08-11 LAB — COMPREHENSIVE METABOLIC PANEL
ALT: 12 U/L (ref 0–44)
AST: 23 U/L (ref 15–41)
Albumin: 3 g/dL — ABNORMAL LOW (ref 3.5–5.0)
Alkaline Phosphatase: 178 U/L — ABNORMAL HIGH (ref 38–126)
Anion gap: 13 (ref 5–15)
BUN: 10 mg/dL (ref 6–20)
CO2: 20 mmol/L — ABNORMAL LOW (ref 22–32)
Calcium: 9.9 mg/dL (ref 8.9–10.3)
Chloride: 101 mmol/L (ref 98–111)
Creatinine, Ser: 0.84 mg/dL (ref 0.44–1.00)
GFR, Estimated: >60 mL/min (ref >60)
Glucose, Bld: 92 mg/dL (ref 70–99)
Potassium: 3.7 mmol/L (ref 3.5–5.1)
Sodium: 134 mmol/L — ABNORMAL LOW (ref 135–145)
Total Bilirubin: 0.9 mg/dL (ref 0.0–1.2)
Total Protein: 7.3 g/dL (ref 6.5–8.1)

## 2023-08-11 LAB — URINALYSIS, ROUTINE W REFLEX MICROSCOPIC
Bilirubin Urine: NEGATIVE
Glucose, UA: NEGATIVE mg/dL
Hgb urine dipstick: NEGATIVE
Ketones, ur: 20 mg/dL — AB
Leukocytes,Ua: NEGATIVE
Nitrite: NEGATIVE
Protein, ur: NEGATIVE mg/dL
Specific Gravity, Urine: 1.015 (ref 1.005–1.030)
pH: 6 (ref 5.0–8.0)

## 2023-08-11 MED ORDER — LACTATED RINGERS IV BOLUS
1000.0000 mL | Freq: Once | INTRAVENOUS | Status: AC
  Administered 2023-08-11: 1000 mL via INTRAVENOUS

## 2023-08-11 MED ORDER — CYCLOBENZAPRINE HCL 5 MG PO TABS
10.0000 mg | ORAL_TABLET | Freq: Once | ORAL | Status: AC
  Administered 2023-08-11: 10 mg via ORAL
  Filled 2023-08-11: qty 2

## 2023-08-11 NOTE — MAU Note (Signed)
..  Amy [facility] is a 23 y.o. at [redacted]w[redacted]d here in MAU reporting: several episodes of diarrhea last night and contractions. States today she has an increase in pelvic pressure and continues to have lower abdominal cramping. No VB or LOF. +FM.   Pain score: 6 Vitals:   08/11/23 1557  BP: 128/81  Pulse: (!) 126  Resp: 15  Temp: 98.3 F (36.8 C)  SpO2: 100%      Lab orders placed from triage:   UA

## 2023-08-11 NOTE — MAU Provider Note (Signed)
  History     CSN: [id number]  Arrival date and time: 08/11/23 1530   Event Date/Time   First Provider Initiated Contact with Patient 08/11/23 1607      Chief Complaint  Patient presents with   Contractions   Pelvic Pain   HPI: Amy Bond is a 23 y.o. G2P0010 at [redacted]w[redacted]d who presents to maternity assessment reporting vaginal pressure, cramping, and diarrhea. Patient reports multiple episodes of diarrhea beginning yesterday along with pressure, cramping. Patient states pressure and cramping better today. Patient reports episode of suspected rectal bleeding this morning with diarrhea. Denies vaginal bleeding, vaginal leakage. Patient reports tolerating small amount of fluids/food. Denies nausea, vomiting. Gross examination of urine reveals likely dehydration.   +FM. No VB, LOF.  Past Medical History:  Diagnosis Date   Anemia    Anxiety    UTI (urinary tract infection)     Past Surgical History:  Procedure Laterality Date   NO PAST SURGERIES      Family History  Problem Relation Age of Onset   Healthy Mother    Other Mother        2021- killed   Healthy Father    Diabetes Paternal Grandmother    Cancer Neg Hx    Asthma Neg Hx    Heart disease Neg Hx    Hypertension Neg Hx     Social History   Tobacco Use   Smoking status: Never   Smokeless tobacco: Never   Tobacco comments:    Quit vaping May 2024  Vaping Use   Vaping status: Former   Substances: Nicotine, Flavoring  Substance Use Topics   Alcohol use: Never   Drug use: Never    Allergies: No Known Allergies  No medications prior to admission.    Review of Systems Physical Exam   Blood pressure 131/85, pulse 75, temperature 98.3 F (36.8 C), temperature source Oral, resp. rate 15, last menstrual period 11/07/2022, SpO2 100%, unknown if currently breastfeeding.  Physical Exam Constitutional: well-developed, well-nourished female in no acute distress Cardiovascular: tachycardic ~120 Respiratory:  normal effort GI: abd soft, non-tender, gravid WU:[id number] nontender, no edema, normal ROM Neurologic: alert and oriented x 4 GU: vaginal bleeding not appreciated on external exam. Additionally RN cervical check without blood on glove   Cervix 0.5/thick  NST: 125/mod var/+accels/-decels. UI  MAU Course  Procedures  MDM CMP, UA ordered for evaluation of dehydration, electrolyte abnormalities. UA positive for ketones. CMP without significant electrolyte abnormalities. Mild dehydration likely. Single dose PO Flexeril given for symptomatic control of cramping. IV LR bolus initiated for treatment of dehydration. Patient reports marked improvement in symptoms with flexeril, fluids. Pulse normalized to 85 bpm following interventions.    Assessment and Plan  Assessment:   Dehydration 2/2 viral gastroenteritis Dehydration workup undertaken for diarrhea/cramping. Patient & partner informed that dehydration can cause uterine irritation resulting in cramping. Patient should continue oral rehydration, bland diet to prevent further dehydration. Will follow-up with outpatient OB at next regularly scheduled visit. Patient & partner informed of return precautions.   Uterine irritability Not in labor.  FWB: Reactive NST. +FM.  Plan: Discharge to home with outpatient OB follow-up  Note prepared by Arlyce Dice, student PA  Joanne Gavel 08/11/2023, 6:03 PM

## 2023-08-12 ENCOUNTER — Ambulatory Visit: Payer: Medicaid Other

## 2023-08-12 ENCOUNTER — Telehealth: Payer: Medicaid Other | Admitting: Obstetrics and Gynecology

## 2023-08-12 ENCOUNTER — Ambulatory Visit: Payer: Medicaid Other | Attending: Obstetrics and Gynecology

## 2023-08-12 ENCOUNTER — Other Ambulatory Visit: Payer: Self-pay

## 2023-08-12 VITALS — BP 120/78 | HR 80

## 2023-08-12 VITALS — BP 124/81 | HR 101

## 2023-08-12 DIAGNOSIS — Z3A38 38 weeks gestation of pregnancy: Secondary | ICD-10-CM

## 2023-08-12 DIAGNOSIS — O358XX0 Maternal care for other (suspected) fetal abnormality and damage, not applicable or unspecified: Secondary | ICD-10-CM | POA: Diagnosis not present

## 2023-08-12 DIAGNOSIS — O365930 Maternal care for other known or suspected poor fetal growth, third trimester, not applicable or unspecified: Secondary | ICD-10-CM

## 2023-08-12 DIAGNOSIS — O368390 Maternal care for abnormalities of the fetal heart rate or rhythm, unspecified trimester, not applicable or unspecified: Secondary | ICD-10-CM

## 2023-08-12 DIAGNOSIS — O26893 Other specified pregnancy related conditions, third trimester: Secondary | ICD-10-CM

## 2023-08-12 DIAGNOSIS — O09899 Supervision of other high risk pregnancies, unspecified trimester: Secondary | ICD-10-CM

## 2023-08-12 DIAGNOSIS — O0993 Supervision of high risk pregnancy, unspecified, third trimester: Secondary | ICD-10-CM | POA: Diagnosis present

## 2023-08-12 DIAGNOSIS — O99013 Anemia complicating pregnancy, third trimester: Secondary | ICD-10-CM

## 2023-08-12 DIAGNOSIS — N856 Intrauterine synechiae: Secondary | ICD-10-CM | POA: Insufficient documentation

## 2023-08-12 DIAGNOSIS — D563 Thalassemia minor: Secondary | ICD-10-CM

## 2023-08-12 DIAGNOSIS — R790 Abnormal level of blood mineral: Secondary | ICD-10-CM

## 2023-08-12 DIAGNOSIS — O360130 Maternal care for anti-D [Rh] antibodies, third trimester, not applicable or unspecified: Secondary | ICD-10-CM | POA: Diagnosis not present

## 2023-08-12 DIAGNOSIS — Z6791 Unspecified blood type, Rh negative: Secondary | ICD-10-CM

## 2023-08-12 NOTE — Progress Notes (Signed)
Virtual ROB    Recent MAU visit pt reports she she was told she was dehydrated. Pt Doing much better today

## 2023-08-12 NOTE — Progress Notes (Signed)
TELEHEALTH OBSTETRICS VISIT ENCOUNTER NOTE  Provider location: Center for [facility]   Patient location: Home  I connected with Amy Bond on 08/12/23 at 10:35 AM EST by telephone at home and verified that I am speaking with the correct person using two identifiers. Of note, unable to do video encounter due to technical difficulties.    I discussed the limitations, risks, security and privacy concerns of performing an evaluation and management service by telephone and the availability of in person appointments. I also discussed with the patient that there may be a patient responsible charge related to this service. The patient expressed understanding and agreed to proceed.  Subjective:  Amy Bond is a 23 y.o. G2P0010 at [redacted]w[redacted]d being followed for ongoing prenatal care.  She is currently monitored for the following issues for this high-risk pregnancy and has Rh negative status during pregnancy; Supervision of high-risk pregnancy; Anemia of mother in pregnancy, antepartum, third trimester; Thalassemia alpha carrier; Intrauterine synechiae; Two vessel umbilical cord, antepartum; and Low ferritin on their problem list.  Patient reports no complaints. Reports fetal movement. Denies any contractions, bleeding or leaking of fluid.   The following portions of the patient's history were reviewed and updated as appropriate: allergies, current medications, past family history, past medical history, past social history, past surgical history and problem list.   Objective:  Blood pressure 120/78, pulse 80, last menstrual period 11/07/2022, unknown if currently breastfeeding. General:  Alert, oriented and cooperative.   Mental Status: Normal mood and affect perceived. Normal judgment and thought content.  Rest of physical exam deferred due to type of encounter  Assessment and Plan:  Pregnancy: G2P0010 at [redacted]w[redacted]d 1. [redacted] weeks gestation of pregnancy (Primary) GBS  neg  2. Poor fetal growth affecting management of mother in third trimester, single or unspecified fetus Patient set up for 2/18 @ 2345 IOL Resolved FGR. Qwk bpp today with mfm and will bring back at 39wks for NST in office. 2/5: 24%, 2813g, ac 31%, bpp 8/8, afi 10, cephalic.   3. Intrauterine synechiae  4. Two vessel umbilical cord, antepartum  5. Rh negative status during pregnancy in third trimester Rhogam pp prn  6. Anemia of mother in pregnancy, antepartum, third trimester S/p venofer 300 x 3, last dose on 11/25     Latest Ref Rng & Units 05/19/2023    5:28 PM 05/18/2023    8:55 AM 04/26/2023    5:06 PM  CBC  WBC 4.0 - 10.5 K/uL 7.3  6.7  7.4   Hemoglobin 12.0 - 15.0 g/dL 9.5  96.0  9.7   Hematocrit 36.0 - 46.0 % 30.9  34.2  31.5   Platelets 150 - 400 K/uL 261  302  242    7. Low ferritin  8. Thalassemia alpha carrier  Term labor symptoms and general obstetric precautions including but not limited to vaginal bleeding, contractions, leaking of fluid and fetal movement were reviewed in detail with the patient.  I discussed the assessment and treatment plan with the patient. The patient was provided an opportunity to ask questions and all were answered. The patient agreed with the plan and demonstrated an understanding of the instructions. The patient was advised to call back or seek an in-person office evaluation/go to MAU at [facility] for any urgent or concerning symptoms. Please refer to After Visit Summary for other counseling recommendations.   I provided 10 minutes of non-face-to-face time during this encounter.  Return in  about 4 days (around 08/16/2023) for in person, 2/17 or 2/18 in office NST .  Future Appointments  Date Time Provider Department Center  08/12/2023  3:15 PM [facility] NURSE [facility]  08/12/2023  3:30 PM [facility] US1 [facility]    Paul Bing, MD Center for [employer], [facility]

## 2023-08-13 ENCOUNTER — Encounter (HOSPITAL_COMMUNITY): Payer: Self-pay | Admitting: *Deleted

## 2023-08-13 ENCOUNTER — Telehealth (HOSPITAL_COMMUNITY): Payer: Self-pay | Admitting: *Deleted

## 2023-08-13 NOTE — Telephone Encounter (Signed)
Preadmission screen

## 2023-08-17 ENCOUNTER — Other Ambulatory Visit: Payer: Self-pay | Admitting: Advanced Practice Midwife

## 2023-08-18 ENCOUNTER — Inpatient Hospital Stay (HOSPITAL_COMMUNITY): Payer: Medicaid Other | Admitting: Anesthesiology

## 2023-08-18 ENCOUNTER — Inpatient Hospital Stay (HOSPITAL_COMMUNITY)
Admission: RE | Admit: 2023-08-18 | Discharge: 2023-08-20 | DRG: 807 | Disposition: A | Discharge status: Home / Self Care | Payer: Medicaid Other | Attending: Family Medicine | Admitting: Family Medicine

## 2023-08-18 ENCOUNTER — Other Ambulatory Visit: Payer: Self-pay

## 2023-08-18 ENCOUNTER — Encounter: Payer: Medicaid Other | Admitting: Family Medicine

## 2023-08-18 ENCOUNTER — Inpatient Hospital Stay (HOSPITAL_COMMUNITY): Payer: Medicaid Other

## 2023-08-18 ENCOUNTER — Encounter (HOSPITAL_COMMUNITY): Payer: Self-pay | Admitting: Obstetrics and Gynecology

## 2023-08-18 DIAGNOSIS — O9902 Anemia complicating childbirth: Secondary | ICD-10-CM | POA: Diagnosis present

## 2023-08-18 DIAGNOSIS — Z6791 Unspecified blood type, Rh negative: Secondary | ICD-10-CM | POA: Diagnosis not present

## 2023-08-18 DIAGNOSIS — Z148 Genetic carrier of other disease: Secondary | ICD-10-CM | POA: Diagnosis not present

## 2023-08-18 DIAGNOSIS — O0990 Supervision of high risk pregnancy, unspecified, unspecified trimester: Secondary | ICD-10-CM

## 2023-08-18 DIAGNOSIS — Z833 Family history of diabetes mellitus: Secondary | ICD-10-CM

## 2023-08-18 DIAGNOSIS — O26893 Other specified pregnancy related conditions, third trimester: Secondary | ICD-10-CM | POA: Diagnosis present

## 2023-08-18 DIAGNOSIS — Z3A39 39 weeks gestation of pregnancy: Secondary | ICD-10-CM | POA: Diagnosis not present

## 2023-08-18 DIAGNOSIS — O26899 Other specified pregnancy related conditions, unspecified trimester: Secondary | ICD-10-CM

## 2023-08-18 DIAGNOSIS — R030 Elevated blood-pressure reading, without diagnosis of hypertension: Secondary | ICD-10-CM | POA: Diagnosis not present

## 2023-08-18 DIAGNOSIS — O43193 Other malformation of placenta, third trimester: Secondary | ICD-10-CM | POA: Diagnosis not present

## 2023-08-18 DIAGNOSIS — O09899 Supervision of other high risk pregnancies, unspecified trimester: Secondary | ICD-10-CM

## 2023-08-18 DIAGNOSIS — R790 Abnormal level of blood mineral: Secondary | ICD-10-CM | POA: Diagnosis present

## 2023-08-18 DIAGNOSIS — O99013 Anemia complicating pregnancy, third trimester: Secondary | ICD-10-CM | POA: Diagnosis present

## 2023-08-18 DIAGNOSIS — N856 Intrauterine synechiae: Secondary | ICD-10-CM | POA: Diagnosis present

## 2023-08-18 DIAGNOSIS — D563 Thalassemia minor: Secondary | ICD-10-CM | POA: Diagnosis present

## 2023-08-18 LAB — CBC
HCT: 37.2 % (ref 36.0–46.0)
Hemoglobin: 11.7 g/dL — ABNORMAL LOW (ref 12.0–15.0)
MCH: 23.4 pg — ABNORMAL LOW (ref 26.0–34.0)
MCHC: 31.5 g/dL (ref 30.0–36.0)
MCV: 74.3 fL — ABNORMAL LOW (ref 80.0–100.0)
Platelets: 310 10*3/uL (ref 150–400)
RBC: 5.01 MIL/uL (ref 3.87–5.11)
RDW: 14.4 % (ref 11.5–15.5)
WBC: 7.9 10*3/uL (ref 4.0–10.5)
nRBC: 0 % (ref 0.0–0.2)

## 2023-08-18 LAB — TYPE AND SCREEN
ABO/RH(D): O NEG
Antibody Screen: POSITIVE

## 2023-08-18 LAB — RPR: RPR Ser Ql: NONREACTIVE

## 2023-08-18 MED ORDER — SENNOSIDES-DOCUSATE SODIUM 8.6-50 MG PO TABS
2.0000 | ORAL_TABLET | Freq: Every day | ORAL | Status: DC
  Administered 2023-08-19 – 2023-08-20 (×2): 2 via ORAL
  Filled 2023-08-18 (×2): qty 2

## 2023-08-18 MED ORDER — PRENATAL MULTIVITAMIN CH
1.0000 | ORAL_TABLET | Freq: Every day | ORAL | Status: DC
  Administered 2023-08-19 – 2023-08-20 (×2): 1 via ORAL
  Filled 2023-08-18 (×2): qty 1

## 2023-08-18 MED ORDER — DIPHENHYDRAMINE HCL 50 MG/ML IJ SOLN: 12.5000 mg | INTRAMUSCULAR | Status: DC | PRN

## 2023-08-18 MED ORDER — MISOPROSTOL 50MCG HALF TABLET: 50.0000 ug | ORAL_TABLET | ORAL | Status: DC

## 2023-08-18 MED ORDER — OXYTOCIN-SODIUM CHLORIDE 30-0.9 UT/500ML-% IV SOLN: 2.5000 [IU]/h | INTRAVENOUS | Status: DC

## 2023-08-18 MED ORDER — FENTANYL-BUPIVACAINE-NACL 0.5-0.125-0.9 MG/250ML-% EP SOLN
12.0000 mL/h | EPIDURAL | Status: DC | PRN
  Administered 2023-08-18: 12 mL/h via EPIDURAL
  Filled 2023-08-18: qty 250

## 2023-08-18 MED ORDER — ONDANSETRON HCL 4 MG/2ML IJ SOLN: 4.0000 mg | INTRAMUSCULAR | Status: DC | PRN

## 2023-08-18 MED ORDER — SIMETHICONE 80 MG PO CHEW: 80.0000 mg | CHEWABLE_TABLET | ORAL | Status: DC | PRN

## 2023-08-18 MED ORDER — WITCH HAZEL-GLYCERIN EX PADS: 1.0000 | MEDICATED_PAD | CUTANEOUS | Status: DC | PRN

## 2023-08-18 MED ORDER — COCONUT OIL OIL
1.0000 | TOPICAL_OIL | Status: DC | PRN
  Administered 2023-08-20: 1 via TOPICAL

## 2023-08-18 MED ORDER — ACETAMINOPHEN 500 MG PO TABS
1000.0000 mg | ORAL_TABLET | Freq: Three times a day (TID) | ORAL | Status: DC
  Administered 2023-08-19 – 2023-08-20 (×5): 1000 mg via ORAL
  Filled 2023-08-18 (×5): qty 2

## 2023-08-18 MED ORDER — LACTATED RINGERS IV SOLN
500.0000 mL | Freq: Once | INTRAVENOUS | Status: AC
  Administered 2023-08-18: 500 mL via INTRAVENOUS

## 2023-08-18 MED ORDER — LIDOCAINE HCL (PF) 1 % IJ SOLN
INTRAMUSCULAR | Status: DC | PRN
  Administered 2023-08-18: 5 mL via EPIDURAL
  Administered 2023-08-18: 4 mL via EPIDURAL

## 2023-08-18 MED ORDER — ONDANSETRON HCL 4 MG PO TABS: 4.0000 mg | ORAL_TABLET | ORAL | Status: DC | PRN

## 2023-08-18 MED ORDER — LIDOCAINE HCL (PF) 1 % IJ SOLN: 30.0000 mL | INTRAMUSCULAR | Status: DC | PRN

## 2023-08-18 MED ORDER — DIBUCAINE (PERIANAL) 1 % EX OINT: 1.0000 | TOPICAL_OINTMENT | CUTANEOUS | Status: DC | PRN

## 2023-08-18 MED ORDER — BENZOCAINE-MENTHOL 20-0.5 % EX AERO: 1.0000 | INHALATION_SPRAY | CUTANEOUS | Status: DC | PRN

## 2023-08-18 MED ORDER — ZOLPIDEM TARTRATE 5 MG PO TABS: 5.0000 mg | ORAL_TABLET | Freq: Every evening | ORAL | Status: DC | PRN

## 2023-08-18 MED ORDER — EPHEDRINE 5 MG/ML INJ: 10.0000 mg | INTRAVENOUS | Status: DC | PRN

## 2023-08-18 MED ORDER — TERBUTALINE SULFATE 1 MG/ML IJ SOLN: 0.2500 mg | Freq: Once | INTRAMUSCULAR | Status: DC | PRN

## 2023-08-18 MED ORDER — FENTANYL CITRATE (PF) 100 MCG/2ML IJ SOLN
100.0000 ug | INTRAMUSCULAR | Status: DC | PRN
  Administered 2023-08-18: 100 ug via INTRAVENOUS

## 2023-08-18 MED ORDER — OXYTOCIN-SODIUM CHLORIDE 30-0.9 UT/500ML-% IV SOLN
1.0000 m[IU]/min | INTRAVENOUS | Status: DC
Start: 2023-08-18 — End: 2023-08-18
  Administered 2023-08-18: 2 m[IU]/min via INTRAVENOUS
  Filled 2023-08-18: qty 500

## 2023-08-18 MED ORDER — PHENYLEPHRINE 80 MCG/ML (10ML) SYRINGE FOR IV PUSH (FOR BLOOD PRESSURE SUPPORT): 80.0000 ug | PREFILLED_SYRINGE | INTRAVENOUS | Status: DC | PRN

## 2023-08-18 MED ORDER — DIPHENHYDRAMINE HCL 25 MG PO CAPS: 25.0000 mg | ORAL_CAPSULE | Freq: Four times a day (QID) | ORAL | Status: DC | PRN

## 2023-08-18 MED ORDER — MISOPROSTOL 50MCG HALF TABLET
50.0000 ug | ORAL_TABLET | Freq: Once | ORAL | Status: AC
  Administered 2023-08-18: 50 ug via ORAL
  Filled 2023-08-18: qty 1

## 2023-08-18 MED ORDER — OXYCODONE HCL 5 MG PO TABS: 10.0000 mg | ORAL_TABLET | Freq: Four times a day (QID) | ORAL | Status: DC | PRN

## 2023-08-18 MED ORDER — PHENYLEPHRINE 80 MCG/ML (10ML) SYRINGE FOR IV PUSH (FOR BLOOD PRESSURE SUPPORT)
80.0000 ug | PREFILLED_SYRINGE | INTRAVENOUS | Status: DC | PRN
  Filled 2023-08-18: qty 10

## 2023-08-18 MED ORDER — MEDROXYPROGESTERONE ACETATE 150 MG/ML IM SUSP: 150.0000 mg | INTRAMUSCULAR | Status: DC | PRN

## 2023-08-18 MED ORDER — LACTATED RINGERS IV SOLN: 500.0000 mL | Freq: Once | INTRAVENOUS | Status: DC

## 2023-08-18 MED ORDER — LACTATED RINGERS IV SOLN
500.0000 mL | INTRAVENOUS | Status: DC | PRN
  Administered 2023-08-18: 500 mL via INTRAVENOUS

## 2023-08-18 MED ORDER — IBUPROFEN 800 MG PO TABS
800.0000 mg | ORAL_TABLET | Freq: Three times a day (TID) | ORAL | Status: DC
  Administered 2023-08-19 – 2023-08-20 (×4): 800 mg via ORAL
  Filled 2023-08-18 (×4): qty 1

## 2023-08-18 MED ORDER — ONDANSETRON HCL 4 MG/2ML IJ SOLN: 4.0000 mg | Freq: Four times a day (QID) | INTRAMUSCULAR | Status: DC | PRN

## 2023-08-18 MED ORDER — OXYCODONE HCL 5 MG PO TABS: 5.0000 mg | ORAL_TABLET | Freq: Four times a day (QID) | ORAL | Status: DC | PRN

## 2023-08-18 MED ORDER — FENTANYL CITRATE (PF) 100 MCG/2ML IJ SOLN
50.0000 ug | INTRAMUSCULAR | Status: DC | PRN
  Filled 2023-08-18: qty 2

## 2023-08-18 MED ORDER — OXYTOCIN BOLUS FROM INFUSION
333.0000 mL | Freq: Once | INTRAVENOUS | Status: AC
  Administered 2023-08-18: 333 mL via INTRAVENOUS

## 2023-08-18 MED ORDER — ACETAMINOPHEN 325 MG PO TABS: 650.0000 mg | ORAL_TABLET | ORAL | Status: DC | PRN

## 2023-08-18 MED ORDER — LACTATED RINGERS IV SOLN: INTRAVENOUS | Status: DC

## 2023-08-18 MED ORDER — SOD CITRATE-CITRIC ACID 500-334 MG/5ML PO SOLN: 30.0000 mL | ORAL | Status: DC | PRN

## 2023-08-18 NOTE — Progress Notes (Signed)
 23 yo g2p0 at 39+2 here for iol for resolved IUGR. S/p 2 doses cytotec and foley which just now came out and is 3.5 cm dilated. Cat 1 tracing. Contractions are painful and she desires epidural. Plan will be epidural and then start pitocin. Probable arom at some point.

## 2023-08-18 NOTE — Progress Notes (Signed)
 LABOR PROGRESS NOTE  Patient Name: Amy Bond, female   DOB: 2000/09/09, 23 y.o.  MRN: [id number]  Patient starting to feel some uncomfortable contractions. Cervix 1.5/50/-3. Patient amenable to FB placement. Discussed R/B/A of FB placement, and verbal consent obtained. Placed Cook without difficulty and balloon filled with 60 cc of saline. Mom and babe tolerated well. Additional dose of buccal cytotec given. Cat I tracing.  Joanne Gavel, MD

## 2023-08-18 NOTE — H&P (Signed)
 OBSTETRIC ADMISSION HISTORY AND PHYSICAL  Surie LASHAYLA ARMES is a 23 y.o. female G2P0010 with IUP at [redacted]w[redacted]d by [place] presenting for IOL due to 2-vessel cord, resolved IUGR. She reports +FMs, No LOF, no VB, no blurry vision, headaches or peripheral edema, and RUQ pain.  She plans on breast feeding. She request depo vs Nexplanon for birth control. She received her prenatal care at [facility]   Dating: By [place] at 7 wks --->  Estimated Date of Delivery: 08/23/23  Sono:    @[redacted]w[redacted]d , CWD, normal anatomy, cephalic presentation, right lateral placenta, 2813 g, 24% EFW   Prenatal History/Complications:  2 vessel cord Intrauterine synechiae Anemia Alpha thalassemia silent carrier Rh negative Resolved IUGR at 37 weeks  Past Medical History: Past Medical History:  Diagnosis Date   Anemia    Anxiety    UTI (urinary tract infection)     Past Surgical History: Past Surgical History:  Procedure Laterality Date   NO PAST SURGERIES      Obstetrical History: OB History     Gravida  2   Para      Term      Preterm      AB  1   Living         SAB  1   IAB      Ectopic      Multiple      Live Births              Social History Social History   Socioeconomic History   Marital status: Single    Spouse name: Not on file   Number of children: Not on file   Years of education: Not on file   Highest education level: Not on file  Occupational History   Not on file  Tobacco Use   Smoking status: Never   Smokeless tobacco: Never   Tobacco comments:    Quit vaping May 2024  Vaping Use   Vaping status: Former   Substances: Nicotine, Flavoring  Substance and Sexual Activity   Alcohol use: Never   Drug use: Never   Sexual activity: Yes  Other Topics Concern   Not on file  Social History Narrative   Not on file   Social Drivers of Health   Financial Resource Strain: Not on file  Food Insecurity: No Food Insecurity (08/18/2023)   Hunger Vital Sign    Worried About Running  Out of Food in the Last Year: Never true    Ran Out of Food in the Last Year: Never true  Transportation Needs: No Transportation Needs (08/18/2023)   PRAPARE - Administrator, Civil Service (Medical): No    Lack of Transportation (Non-Medical): No  Physical Activity: Not on file  Stress: Not on file  Social Connections: Not on file    Family History: Family History  Problem Relation Age of Onset   Healthy Mother    Other Mother        2021- killed   Healthy Father    Diabetes Paternal Grandmother    Cancer Neg Hx    Asthma Neg Hx    Heart disease Neg Hx    Hypertension Neg Hx     Allergies: No Known Allergies  Medications Prior to Admission  Medication Sig Dispense Refill Last Dose/Taking   aspirin EC 81 MG tablet Take 1 tablet (81 mg total) by mouth at bedtime. Start taking when you are [redacted] weeks pregnant for rest of pregnancy for prevention of  preeclampsia 300 tablet 2    Prenatal Vit-Fe Fumarate-FA (PRENATAL MULTIVITAMIN) TABS tablet Take 1 tablet by mouth daily at 12 noon.        Review of Systems   All systems reviewed and negative except as stated in HPI  Height 5\' 5"  (1.651 m), weight 63 kg, last menstrual period 11/07/2022, unknown if currently breastfeeding. General appearance: alert, cooperative, and appears stated age Lungs: no increased WOB Heart: regular rate Abdomen: soft, non-tender; gravid Pelvic: normal external genitalia Presentation: cephalic Fetal monitoring Baseline: 145 bpm Uterine activity: rare, asymptomatic  Dilation: 1 Effacement (%): 40 Station: -3 Exam by:: Dr Earlene Plater   Prenatal labs: ABO, Rh: --/--/PENDING (02/19 0021) Antibody: PENDING (02/19 0021) Rubella: 1.01 (07/30 1026) RPR: Non Reactive (11/19 0855)  HBsAg: Negative (07/30 1026)  HIV: Non Reactive (11/19 0855)  GBS: Negative/-- (01/28 1556)    Lab Results  Component Value Date   GBS Negative 07/27/2023   GTT normal  Genetic screening  NIPS LR Rh neg fetus,  horizon alpha thalassemia carrier Anatomy US 2 vessel cord and uterine synechiae  Immunization History  Administered Date(s) Administered   Influenza, Seasonal, Injecte, Preservative Fre 04/27/2023    Prenatal Transfer Tool  Maternal Diabetes: No Genetic Screening: Normal Maternal Ultrasounds/Referrals: IUGR - resolved Fetal Ultrasounds or other Referrals:  None Maternal Substance Abuse:  No Significant Maternal Medications:  None Significant Maternal Lab Results: Group B Strep negative and Rh negative Number of Prenatal Visits:greater than 3 verified prenatal visits Maternal Vaccinations:TDap and Covid Other Comments:  None   Results for orders placed or performed during the hospital encounter of 08/18/23 (from the past 24 hours)  CBC   Collection Time: 08/18/23 12:21 AM  Result Value Ref Range   WBC 7.9 4.0 - 10.5 K/uL   RBC 5.01 3.87 - 5.11 MIL/uL   Hemoglobin 11.7 (L) 12.0 - 15.0 g/dL   HCT 16.1 09.6 - 04.5 %   MCV 74.3 (L) 80.0 - 100.0 fL   MCH 23.4 (L) 26.0 - 34.0 pg   MCHC 31.5 30.0 - 36.0 g/dL   RDW 40.9 81.1 - 91.4 %   Platelets 310 150 - 400 K/uL   nRBC 0.0 0.0 - 0.2 %  Type and screen   Collection Time: 08/18/23 12:21 AM  Result Value Ref Range   ABO/RH(D) PENDING    Antibody Screen PENDING    Sample Expiration      08/21/2023,2359 Performed at [facility], [address] [place], [place] [address]     Patient Active Problem List   Diagnosis Date Noted   Encounter for induction of labor 08/18/2023   Low ferritin 04/28/2023   Thalassemia alpha carrier 03/29/2023   Intrauterine synechiae 03/29/2023   Two vessel umbilical cord, antepartum 03/29/2023   Anemia of mother in pregnancy, antepartum, third trimester 01/26/2023   Supervision of high-risk pregnancy 01/05/2023   Rh negative status during pregnancy 06/16/2022    Assessment/Plan:  DANAH REINECKE is a 23 y.o. G2P0010 at [redacted]w[redacted]d here for IOL for resolved IUGR, 2 VC  #Labor: Elects to  start with cytotec. Single dose given resolved IUGR and 2 VC. Discussed roles for FB, pit, AROM #Pain: Per patient request #FWB: Cat I #GBS status: negative #Feeding: Breastmilk  #Reproductive Life planning: Undecided  #Rh neg: Rhogam eval post-delivery  Joanne Gavel, MD

## 2023-08-18 NOTE — Plan of Care (Signed)

## 2023-08-18 NOTE — Anesthesia Procedure Notes (Signed)
 Epidural Patient location during procedure: OB Start time: 08/18/2023 10:30 AM End time: 08/18/2023 10:32 AM  Staffing Anesthesiologist: Beryle Lathe, MD Performed: anesthesiologist   Preanesthetic Checklist Completed: patient identified, IV checked, risks and benefits discussed, monitors and equipment checked, pre-op evaluation and timeout performed  Epidural Patient position: sitting Prep: DuraPrep Patient monitoring: continuous pulse ox and blood pressure Approach: midline Location: L2-L3 Injection technique: LOR saline  Needle:  Needle type: Tuohy  Needle gauge: 17 G Needle length: 9 cm Needle insertion depth: 5 cm Catheter size: 19 Gauge Catheter at skin depth: 10 cm Test dose: negative and Other (1% lidocaine)  Assessment Events: blood not aspirated and no cerebrospinal fluid  Additional Notes Patient identified. Risks including, but not limited to, bleeding, infection, nerve damage, paralysis, inadequate analgesia, blood pressure changes, nausea, vomiting, allergic reaction, postpartum back pain, itching, and headache were discussed. Patient expressed understanding and wished to proceed. Sterile prep and drape, including hand hygiene, mask, and sterile gloves were used. The patient was positioned and the spine was prepped. The skin was anesthetized with lidocaine. No paraesthesia or other complication noted. The patient did not experience any signs of intravascular injection such as tinnitus or metallic taste in mouth, nor signs of intrathecal spread such as rapid motor block. Please see nursing notes for vital signs. The patient tolerated the procedure well.   Leslye Peer, MDReason for block:procedure for pain

## 2023-08-18 NOTE — Discharge Summary (Signed)
 Postpartum Discharge Summary  Date of Service updated***     Patient Name: Amy Bond DOB: 09/13/2000 MRN: [id number]  Date of admission: 08/18/2023 Delivery date:08/18/2023 Delivering provider: Joanne Gavel Date of discharge: 08/18/2023  Admitting diagnosis: Poor fetal growth affecting management of mother [O36.5990] Intrauterine pregnancy: [redacted]w[redacted]d     Secondary diagnosis:  Principal Problem:   SVD (spontaneous vaginal delivery) Active Problems:   Rh negative status during pregnancy   Supervision of high-risk pregnancy   Anemia of mother in pregnancy, antepartum, third trimester   Thalassemia alpha carrier   Intrauterine synechiae   Two vessel umbilical cord, antepartum   Low ferritin  Additional problems: None    Discharge diagnosis: Term Pregnancy Delivered                                              Post partum procedures:*** Augmentation: AROM, Pitocin, Cytotec, and IP Foley Complications: None  Hospital course: Induction of Labor With Vaginal Delivery   23 y.o. [name] at [redacted]w[redacted]d was admitted to the hospital 08/18/2023 for induction of labor.  Indication for induction:  resolved IUGR with 2VC . Patient had an labor course complicated by none. Membrane Rupture Time/Date: 3:19 PM,08/18/2023  Delivery Method:Vaginal, Spontaneous Operative Delivery:N/A Episiotomy: None Lacerations:  1st degree;Vaginal;Labial Details of delivery can be found in separate delivery note.  Patient had a postpartum course complicated by***. Patient is discharged home 08/18/23.  Newborn Data: Birth date:08/18/2023 Birth time:9:36 PM Gender:Female Living status:Living Apgars: ,  Weight:   Magnesium Sulfate received: No BMZ received: No Rhophylac:{Rhophylac received:30440032} MMR:No T-DaP:Given prenatally Flu: No RSV Vaccine received: No Transfusion:{Transfusion received:30440034}  Immunizations received: Immunization History  Administered Date(s) Administered    Influenza, Seasonal, Injecte, Preservative Fre 04/27/2023    Physical exam  Vitals:   08/18/23 2030 08/18/23 2100 08/18/23 2145 08/18/23 2200  BP: 128/82 125/88 138/85 111/84  Pulse: 81 71 67   Resp:      Temp:      TempSrc:      SpO2: 99% 100%    Weight:      Height:       General: {Exam; general:21111117} Lochia: {Desc; appropriate/inappropriate:30686::"appropriate"} Uterine Fundus: {Desc; firm/soft:30687} Incision: {Exam; incision:21111123} DVT Evaluation: {Exam; dvt:2111122} Labs: Lab Results  Component Value Date   WBC 7.9 08/18/2023   HGB 11.7 (L) 08/18/2023   HCT 37.2 08/18/2023   MCV 74.3 (L) 08/18/2023   PLT 310 08/18/2023      Latest Ref Rng & Units 08/11/2023    4:17 PM  CMP  Glucose 70 - 99 mg/dL 92   BUN 6 - 20 mg/dL 10   Creatinine 4.09 - 1.00 mg/dL 8.11   Sodium 914 - 782 mmol/L 134   Potassium 3.5 - 5.1 mmol/L 3.7   Chloride 98 - 111 mmol/L 101   CO2 22 - 32 mmol/L 20   Calcium 8.9 - 10.3 mg/dL 9.9   Total Protein 6.5 - 8.1 g/dL 7.3   Total Bilirubin 0.0 - 1.2 mg/dL 0.9   Alkaline Phos 38 - 126 U/L 178   AST 15 - 41 U/L 23   ALT 0 - 44 U/L 12    Edinburgh Score:     No data to display         No data recorded  After visit meds:  Allergies as of 08/18/2023  No Known Allergies   Med Rec must be completed prior to using this [facility]***        Discharge home in stable condition Infant Feeding: {Baby feeding:23562} Infant Disposition:{CHL IP OB HOME WITH ZOXWRU:04540} Discharge instruction: per After Visit Summary and Postpartum booklet. Activity: Advance as tolerated. Pelvic rest for 6 weeks.  Diet: {OB JWJX:[id number]} Future Appointments:No future appointments. Follow up Visit:  Message sent to [facility] 08/18/23  Please schedule this patient for a In person postpartum visit in 6 weeks with the following provider: Any provider. Additional Postpartum F/U: None   High risk pregnancy complicated by:  2VC, intrauterine  synechiae Delivery mode:  Vaginal, Spontaneous Anticipated Birth Control:  Unsure   08/18/2023 Joanne Gavel, MD

## 2023-08-18 NOTE — Progress Notes (Signed)
 23 yo g2p0 at 39+2 here for iol for resolved IUGR. Cat 1 tracing. 5 cm dilated. Comfortable on epidural. After discussion of risks including fetal intolerance, cord prolapse, arom (clear fluid) performed. Positive scalp stim. Continuing pitocin.

## 2023-08-18 NOTE — Anesthesia Preprocedure Evaluation (Signed)
 Anesthesia Evaluation  Patient identified by MRN, date of birth, ID band Patient awake    Reviewed: Allergy & Precautions, NPO status , Patient's Chart, lab work & pertinent test results  History of Anesthesia Complications Negative for: history of anesthetic complications  Airway Mallampati: II   Neck ROM: Full    Dental   Pulmonary neg pulmonary ROS   Pulmonary exam normal        Cardiovascular negative cardio ROS Normal cardiovascular exam     Neuro/Psych  PSYCHIATRIC DISORDERS Anxiety     negative neurological ROS     GI/Hepatic negative GI ROS, Neg liver ROS,,,  Endo/Other  negative endocrine ROS    Renal/GU negative Renal ROS     Musculoskeletal  Scoliosis    Abdominal   Peds  Hematology  Thalassemia alpha carrier Plt 310k    Anesthesia Other Findings   Reproductive/Obstetrics (+) Pregnancy                             Anesthesia Physical Anesthesia Plan  ASA: 2  Anesthesia Plan: Epidural   Post-op Pain Management:    Induction:   PONV Risk Score and Plan: 2 and Treatment may vary due to age or medical condition  Airway Management Planned: Natural Airway  Additional Equipment: None  Intra-op Plan:   Post-operative Plan:   Informed Consent: I have reviewed the patients History and Physical, chart, labs and discussed the procedure including the risks, benefits and alternatives for the proposed anesthesia with the patient or authorized representative who has indicated his/her understanding and acceptance.       Plan Discussed with: Anesthesiologist  Anesthesia Plan Comments: (Labs reviewed. Platelets acceptable, patient not taking any blood thinning medications. Per RN, FHR tracing reported to be stable enough for sitting procedure. Risks and benefits discussed with patient, including PDPH, backache, epidural hematoma, failed epidural, blood pressure changes,  allergic reaction, and nerve injury. Patient expressed understanding and wished to proceed.)       Anesthesia Quick Evaluation

## 2023-08-19 NOTE — Progress Notes (Signed)
 Subjective: BREEZIE MICUCCI is a 23 y.o. G2P1011 s/p SVD at [redacted]w[redacted]d.  She reports she doing well. No acute events overnight. She denies any problems with ambulating, voiding or po intake. Denies nausea or vomiting. She has passed flatus. Pain is well controlled.  Lochia is moderate.  Objective: Blood pressure 117/71, pulse 72, temperature 98.2 F (36.8 C), temperature source Oral, resp. rate 16, height 5\' 5"  (1.651 m), weight 63 kg, last menstrual period 11/07/2022, SpO2 99%, unknown if currently breastfeeding.  Physical Exam:  General: alert, cooperative and no distress Chest: no respiratory distress Abdomen: soft, non-tender  Uterine Fundus: firm and at level of umbilicus Extremities: No calf swelling or tenderness  no edema  Recent Labs    08/18/23 0021  HGB 11.7*  HCT 37.2    Assessment/Plan: DAMONI ERKER is a 23 y.o. G2P1011 s/p SVD at [redacted]w[redacted]d for IOL for 2-vessel cord, resolved IUGR.  Routine Postpartum Care: Doing well, pain well-controlled.  -- Continue routine care, lactation support  -- Contraception: unsure  -- Feeding: breast  Dispo: Plan for discharge tomorrow.  Claria Dice, MD Family Medicine Resident, PGY-1 08/19/2023 6:17 AM

## 2023-08-19 NOTE — Lactation Note (Signed)
 This note was copied from a baby's chart. Lactation Consultation Note  Patient Name: Girl Nykira Reddix [id number] Date: 08/19/2023 Age:21 hours Reason for consult: Follow-up assessment;Primapara;1st time breastfeeding;Term;Breastfeeding assistance  P1- MOB reports that infant has been eating well so far, just a little sleepy. LC reviewed the first 24 hr birthday nap, but encouraged MOB/FOB to still wake infant up at least every 3 hrs to eat. Infant had not eaten in 3 hrs at this time, so LC encouraged MOB to latch. MOB stated that she had already attempted and infant was not interested. MOB requested to be set up with a DEBP. LC set up the DEBP with size 21 mm flange. LC noted some colostrum in the left flange, but none in the right after 6 minutes of pumping. LC reviewed how this is normal. LC encouraged MOB to feed EBM when pump is finished.  LC reviewed feeding infant on cue 8-12x in 24 hrs, not allowing infant to go over 3 hrs without a feeding, CDC milk storage guidelines and LC services handout. LC encouraged MOB to call for further assistance as needed.  Maternal Data Has patient been taught Hand Expression?: No Does the patient have breastfeeding experience prior to this delivery?: No  Feeding Mother's Current Feeding Choice: Breast Milk  Lactation Tools Discussed/Used Tools: Pump;Flanges Flange Size: 21 Breast pump type: Double-Electric Breast Pump;Manual Pump Education: Setup, frequency, and cleaning;Milk Storage Reason for Pumping: MOB request Pumping frequency: 15-20 min every 3 hrs  Interventions Interventions: Breast feeding basics reviewed;Expressed milk;Hand pump;DEBP;Education;LC Services brochure  Discharge Discharge Education: Warning signs for feeding baby Pump: DEBP;Personal;Manual  Consult Status Consult Status: Follow-up Date: 08/20/23 Follow-up type: In-patient    Dema Severin BS, IBCLC 08/19/2023, 5:30 PM

## 2023-08-19 NOTE — Progress Notes (Signed)
 CSW received consult for hx of Anxiety; and met with MOB to offer support and complete assessment. CSW introduced herself and acknowledged that FOB was present. MOB gave CSW verbal permission to speak about anything while FOB was present. CSW explained her role and the reason for the visit. MOB presented as calm, was agreeable to consult and remained engaged throughout the encounter.  CSW inquired about MOB's mental health history. MOB reported being diagnosed with anxiety "years ago" due to social struggles. MOB reported participating in therapy (2020) and found the support helpful. MOB reported coping strategies that have been supportive overtime; which included drawing and listening to music. MOB reported her supports as  FOB, her grandmother and brother. MOB reported currently feeling "good" and bonded well with the infant. CSW provided education regarding the baby blues period vs. perinatal mood disorders, discussed treatment and gave resources for mental health follow up if concerns arise.  CSW recommends self-evaluation during the postpartum time period using the New Mom Checklist from Postpartum Progress and encouraged MOB to contact a medical professional if symptoms are noted at any time.    CSW asked MOB about the pediatrician she has selected for the infant's follow up visits; MOB said [facility]. MOB reported having all essential items for the infant including a carseat, bassinet and crib for safe sleeping. CSW provided review of Sudden Infant Death Syndrome (SIDS) precautions.    CSW identifies no further need for intervention and no barriers to discharge at this time.  Enos Fling, Theresia Majors Clinical Social Worker [phone]

## 2023-08-19 NOTE — Anesthesia Postprocedure Evaluation (Signed)
 Anesthesia Post Note  Patient: Amy Bond  Procedure(s) Performed: AN AD HOC LABOR EPIDURAL     Patient location during evaluation: Mother Baby Anesthesia Type: Epidural Level of consciousness: awake and alert and oriented Pain management: satisfactory to patient Vital Signs Assessment: post-procedure vital signs reviewed and stable Respiratory status: respiratory function stable Cardiovascular status: stable Postop Assessment: no headache, no backache, epidural receding, patient able to bend at knees, no signs of nausea or vomiting, adequate PO intake and able to ambulate Anesthetic complications: no   No notable events documented.  Last Vitals:  Vitals:   08/19/23 0419 08/19/23 0814  BP: 117/71 126/79  Pulse: 72 (!) 58  Resp: 16 16  Temp: 36.8 C 36.6 C  SpO2: 99% 100%    Last Pain:  Vitals:   08/19/23 0814  TempSrc: Oral  PainSc: 0-No pain   Pain Goal:                Epidural/Spinal Function Cutaneous sensation: Normal sensation (08/19/23 0814), Patient able to flex knees: Yes (08/19/23 0814), Patient able to lift hips off bed: Yes (08/19/23 0814), Back pain beyond tenderness at insertion site: No (08/19/23 0814), Progressively worsening motor and/or sensory loss: No (08/19/23 0814), Bowel and/or bladder incontinence post epidural: No (08/19/23 0814)  Karleen Dolphin

## 2023-08-19 NOTE — Lactation Note (Signed)
 This note was copied from a baby's chart. Lactation Consultation Note  Patient Name: Amy Bond [id number] Date: 08/19/2023 Age:21 hours Reason for consult: Initial assessment;1st time breastfeeding;Primapara;Term  P1, 39 wks, @ 12 hrs of life. Infant nipple feeding on right breast with LC arrival. Demonstrated breaking latch and steps of re-latching baby with big mouth/ deep latching. Discussed feeding every 3 hrs, Day 1 sleepy- shorter feed durations. Starting with hand expression, using breast compression, and being more hands-on helpful to new baby learning breast. Day 2 more feeding cues, and cluster feeding overnights brings in milk supply. Mom has electric & hand pump @ home. Highlighted hand pump great with engorgement. Demonstrated latch a couple times for mom, offered positioning adjustments. Encouraged big mouth latches and using EBM or coconut oil post feeds for nipple health. LC services and milk storage shared.   Maternal Data Does the patient have breastfeeding experience prior to this delivery?: No  Feeding Mother's Current Feeding Choice: Breast Milk  LATCH Score Latch: Grasps breast easily, tongue down, lips flanged, rhythmical sucking.  Audible Swallowing: Spontaneous and intermittent  Type of Nipple: Everted at rest and after stimulation  Comfort (Breast/Nipple): Soft / non-tender  Hold (Positioning): Assistance needed to correctly position infant at breast and maintain latch.  LATCH Score: 9   Lactation Tools Discussed/Used    Interventions Interventions: Breast feeding basics reviewed;Assisted with latch;Hand express;Breast compression;Adjust position;Expressed milk;Coconut oil;Education;LC Services brochure (Milk Storage Guidelines)  Discharge Pump: Manual;Personal (Per mom has hand pump and electric pump @ home)  Consult Status Consult Status: Follow-up Date: 08/20/23 Follow-up type: In-patient    [facility] 08/19/2023, 9:49 AM

## 2023-08-20 ENCOUNTER — Other Ambulatory Visit (HOSPITAL_COMMUNITY): Payer: Self-pay

## 2023-08-20 DIAGNOSIS — R030 Elevated blood-pressure reading, without diagnosis of hypertension: Secondary | ICD-10-CM | POA: Insufficient documentation

## 2023-08-20 LAB — CBC
HCT: 34.1 % — ABNORMAL LOW (ref 36.0–46.0)
Hemoglobin: 10.9 g/dL — ABNORMAL LOW (ref 12.0–15.0)
MCH: 23.5 pg — ABNORMAL LOW (ref 26.0–34.0)
MCHC: 32 g/dL (ref 30.0–36.0)
MCV: 73.7 fL — ABNORMAL LOW (ref 80.0–100.0)
Platelets: 273 10*3/uL (ref 150–400)
RBC: 4.63 MIL/uL (ref 3.87–5.11)
RDW: 14.5 % (ref 11.5–15.5)
WBC: 8.3 10*3/uL (ref 4.0–10.5)
nRBC: 0 % (ref 0.0–0.2)

## 2023-08-20 LAB — COMPREHENSIVE METABOLIC PANEL
ALT: 12 U/L (ref 0–44)
AST: 22 U/L (ref 15–41)
Albumin: 2.5 g/dL — ABNORMAL LOW (ref 3.5–5.0)
Alkaline Phosphatase: 139 U/L — ABNORMAL HIGH (ref 38–126)
Anion gap: 5 (ref 5–15)
BUN: 5 mg/dL — ABNORMAL LOW (ref 6–20)
CO2: 24 mmol/L (ref 22–32)
Calcium: 9.1 mg/dL (ref 8.9–10.3)
Chloride: 108 mmol/L (ref 98–111)
Creatinine, Ser: 0.91 mg/dL (ref 0.44–1.00)
GFR, Estimated: >60 mL/min (ref >60)
Glucose, Bld: 80 mg/dL (ref 70–99)
Potassium: 4.5 mmol/L (ref 3.5–5.1)
Sodium: 137 mmol/L (ref 135–145)
Total Bilirubin: 0.6 mg/dL (ref 0.0–1.2)
Total Protein: 5.9 g/dL — ABNORMAL LOW (ref 6.5–8.1)

## 2023-08-20 LAB — SURGICAL PATHOLOGY

## 2023-08-20 MED ORDER — ACETAMINOPHEN 500 MG PO TABS
1000.0000 mg | ORAL_TABLET | Freq: Four times a day (QID) | ORAL | 0 refills | Status: AC | PRN
  Filled 2023-08-20: qty 30, 4d supply, fill #0

## 2023-08-20 MED ORDER — FUROSEMIDE 20 MG PO TABS
20.0000 mg | ORAL_TABLET | Freq: Every day | ORAL | 0 refills | Status: AC
  Filled 2023-08-20: qty 4, 4d supply, fill #0

## 2023-08-20 MED ORDER — IBUPROFEN 800 MG PO TABS
800.0000 mg | ORAL_TABLET | Freq: Four times a day (QID) | ORAL | 0 refills | Status: AC | PRN
  Filled 2023-08-20: qty 30, 8d supply, fill #0

## 2023-08-20 MED ORDER — FUROSEMIDE 20 MG PO TABS: 20.0000 mg | ORAL_TABLET | Freq: Every day | ORAL | 0 refills | Status: DC

## 2023-08-20 MED ORDER — POTASSIUM CHLORIDE CRYS ER 20 MEQ PO TBCR
40.0000 meq | EXTENDED_RELEASE_TABLET | Freq: Every day | ORAL | Status: DC
  Administered 2023-08-20: 40 meq via ORAL
  Filled 2023-08-20: qty 2

## 2023-08-20 MED ORDER — FUROSEMIDE 20 MG PO TABS
40.0000 mg | ORAL_TABLET | Freq: Every day | ORAL | Status: DC
  Administered 2023-08-20: 40 mg via ORAL
  Filled 2023-08-20: qty 2

## 2023-08-20 MED ORDER — IBUPROFEN 800 MG PO TABS: 800.0000 mg | ORAL_TABLET | Freq: Four times a day (QID) | ORAL | 0 refills | Status: DC | PRN

## 2023-08-20 MED ORDER — ACETAMINOPHEN 500 MG PO TABS: 1000.0000 mg | ORAL_TABLET | Freq: Four times a day (QID) | ORAL | 0 refills | Status: DC | PRN

## 2023-08-20 NOTE — Discharge Instructions (Addendum)
 Seek medical attention if your bp at home is above 150/100 or if you develop significant headache or changes in your visiton

## 2023-08-20 NOTE — Lactation Note (Signed)
 This note was copied from a baby's chart. Lactation Consultation Note  Patient Name: Amy Bond [id number] Date: 08/20/2023 Age:21 hours  Reason for consult: Breastfeeding assistance;Early term 37-38.6wks;1st time breastfeeding;Primapara;Follow-up assessment;Difficult latch;MD order  P1, [redacted]w[redacted]d, 6% weight loss  Mother was attempting to latch baby with a cradle hold while baby was resting on her lap, upon arrival to the room. Mother was receptive to [facility] assist. Basic breastfeeding education, pillow set up with mother's breastfeeding pillow for positioning and alignment to the breast. Mother was taught cross cradle hold to obtain a deeper latch. Baby "[name]" is not opening her mouth with a wide gape. Baby has a shallow latch and once off the breast, mother's nipple is pinched. Several attempts to re-latch on both breast and both nipples have a pinched appearance following latch. Mother is applying coconut oil. Baby has bursts of swallows as mother is able to express colostrum with breast compression.   Mother is motivated to breastfeed. Baby was showing fatigue, baby swaddled and given to dad for mother to pump. Mother was instructed to pump every 3 hours for 15 minutes to stimulate milk production. A Nfant nipple was given for parents to feed expressed milk to baby after breastfeeding.   Mother agreeable to call for the next feeding. A wet diaper was changed.   Mother is motivated to breastfeed successfully. She has support at [facility]. Mother is receptive to a referral to [facility]. Referral sent.    Maternal Data Has patient been taught Hand Expression?: Yes  Feeding Mother's Current Feeding Choice: Breast Milk  LATCH Score Latch: Repeated attempts needed to sustain latch, nipple held in mouth throughout feeding, stimulation needed to elicit sucking reflex.  Audible Swallowing: A few with stimulation  Type of Nipple: Everted at rest and after stimulation  Comfort  (Breast/Nipple): Filling, red/small blisters or bruises, mild/mod discomfort  Hold (Positioning): Assistance needed to correctly position infant at breast and maintain latch.  LATCH Score: 6   Lactation Tools Discussed/Used Tools: Pump;Flanges Flange Size: 21 Reason for Pumping: stimulate milk production Pumping frequency: every 3 hours if not latching well, 4 times per day if baby IS latching well  Interventions Interventions: Breast feeding basics reviewed;Assisted with latch;Skin to skin;Breast compression;Adjust position;Support pillows;DEBP;Education;LC Services brochure  Discharge Discharge Education: Outpatient recommendation Pump: DEBP;Personal  Consult Status Consult Status: Follow-up Date: 08/20/23 (mother to call for next feeding) Follow-up type: In-patient    Christella Hartigan M 08/20/2023, 11:20 AM

## 2023-08-20 NOTE — Lactation Note (Signed)
 This note was copied from a baby's chart. Lactation Consultation Note  Patient Name: Amy Bond [id number] Date: 08/20/2023 Age:21 hours  Reason for consult: Breastfeeding assistance;Early term 37-38.6wks;1st time breastfeeding;Primapara;Follow-up assessment;Difficult latch;MD order  Mother called LC to return for another feeding. Baby was awake and rooting. Mother wanted to give baby her expressed milk by bottle (14 ml). This LC started the bottle feeding. Baby had a disorganized suck with tongue thrusting, moving her tongue around the nipple without sucking and gagging at times. Side hold with paced feeding attempted. Baby drooled the milk from the side of her mouth. Baby had a brief burst of coordinated sucks and swallows (3-4). Baby was placed to the breast in the middle of the bottle feeding when she became fussy. Baby suckled briefly on the breast and was falling asleep. Baby had a large yellow seedy stool mid feeding.  Dad attempted to bottle feed but baby would not suckle well with the bottle. Baby took 12 ml over 30 minutes. RN and MD updated. Baby would benefit to continue to work on feedings and SLP evalution.     Maternal Data Has patient been taught Hand Expression?: Yes  Feeding Mother's Current Feeding Choice: Breast Milk  LATCH Score Latch: Repeated attempts needed to sustain latch, nipple held in mouth throughout feeding, stimulation needed to elicit sucking reflex.  Audible Swallowing: A few with stimulation  Type of Nipple: Everted at rest and after stimulation  Comfort (Breast/Nipple): Filling, red/small blisters or bruises, mild/mod discomfort  Hold (Positioning): Assistance needed to correctly position infant at breast and maintain latch.  LATCH Score: 6   Lactation Tools Discussed/Used Tools: Pump;Flanges Flange Size: 21 Reason for Pumping: stimulate milk production Pumping frequency: every 3 hours if not latching well, 4 times per day if baby IS  latching well Pumped volume: 14 mL  Interventions Interventions: Breast feeding basics reviewed;Assisted with latch;Skin to skin;Breast compression;Adjust position;Support pillows;DEBP;Education;LC Services brochure  Discharge Discharge Education: Outpatient recommendation Pump: DEBP;Personal  Consult Status Consult Status: Follow-up Date: 08/21/23 Follow-up type: In-patient    Christella Hartigan M 08/20/2023, 12:38 PM

## 2023-08-21 ENCOUNTER — Ambulatory Visit (HOSPITAL_COMMUNITY): Payer: Self-pay

## 2023-08-21 NOTE — Lactation Note (Signed)
 This note was copied from a baby's chart. Lactation Consultation Note  Patient Name: Amy Bond [id number] Date: 08/21/2023 Age:21 hours Reason for consult: Follow-up assessment;Term;Primapara Mom was awake sitting on side of bed in darkened room. Baby and support person sleeping. Asked mom how she was doing mom stated good. Asked how BF was going mom stated she is doing much better on the bottle so she is pumping and giving her BM in the bottle.  Mom is satisfied how it is going now. Encouraged mom to call for assistance or questions. Reviewed milk storage.  Maternal Data    Feeding    LATCH Score                    Lactation Tools Discussed/Used    Interventions    Discharge    Consult Status Consult Status: Follow-up Date: 08/21/23 Follow-up type: In-patient    Markita Stcharles, Diamond Nickel 08/21/2023, 12:08 AM

## 2023-08-21 NOTE — Lactation Note (Signed)
 This note was copied from a baby's chart. Lactation Consultation Note  Patient Name: Amy Bond [id number] Date: 08/21/2023 Age:21 hours  Reason for consult: Follow-up assessment;Primapara;1st time breastfeeding;Term;Breastfeeding assistance  P1, [redacted]w[redacted]d, 6.17% weight loss  Mother and father in good spirits and pleased that baby is feeding well and mother is able to express milk. Mother is expressing 30 ml and greater with each pumping. They state baby is tolerating bottle feeding well and "she loves her pacifier".   Mother had stopped latching baby due to nipple soreness and baby doing well with bottle. Offered assistance with breastfeeding using a nipple shield and mother was receptive. A 20 mm NS fit well, the shield was pre-filled with mother's EBM, and mother was able to independently latch baby to breast with NS applied. Baby latched with wide gape and fed with audible swallows for 15 minutes. When baby came off the breast, mother's nipple had extended in the NS and her breast milk was observed. Baby appeared content and satiated.   Mother was instructed how to apply NS, observe for deep latch, listen for swallows and breast softening once milk is in and follow the feeding to observe for milk in the shield. Mother's nipple should appear rounded and not pinched.   Feeding plan established with parents  Latch baby with prefilled nipple shield.  Mother to pump every 3 hours for 15 minutes.  Supplement with expressed milk by bottle if baby  does not appear content after breastfeeding with NS. Wean the NS as baby and mother tolerate. Attend OP lactation consultation appointment for further support and evaluation of milk transfer during breastfeeding.     Feeding Mother's Current Feeding Choice: Breast Milk  LATCH Score Latch: Grasps breast easily, tongue down, lips flanged, rhythmical sucking.  Audible Swallowing: Spontaneous and intermittent  Type of Nipple: Everted at rest and  after stimulation  Comfort (Breast/Nipple): Filling, red/small blisters or bruises, mild/mod discomfort  Hold (Positioning): Assistance needed to correctly position infant at breast and maintain latch.  LATCH Score: 8   Lactation Tools Discussed/Used Tools: Nipple Shields Nipple shield size: 20 Flange Size: 21 Breast pump type: Double-Electric Breast Pump Pumping frequency: every 3 hrs for 15 min Pumped volume: 30 mL (Mother has expressed 105 ml in the last 3 pumping sessions)  Interventions Interventions: Breast feeding basics reviewed;Assisted with latch;Skin to skin;Hand express;Breast compression;Adjust position;Education  Discharge Discharge Education: Engorgement and breast care;Warning signs for feeding baby (OP Lactation Consult referral sent yesterday) Pump: DEBP;Personal ([facility]) WIC Program: Yes  Consult Status Consult Status: Complete Date: 08/21/23    Omar Person 08/21/2023, 10:06 AM

## 2023-08-25 ENCOUNTER — Ambulatory Visit: Payer: Medicaid Other

## 2023-08-28 ENCOUNTER — Telehealth (HOSPITAL_COMMUNITY): Payer: Self-pay | Admitting: *Deleted

## 2023-08-28 NOTE — Telephone Encounter (Signed)
 Attempted hospital discharge follow-up call. Left message for patient to return RN call with any questions or concerns. Deforest Hoyles, RN, 08/28/23, [phone]

## 2023-09-30 ENCOUNTER — Ambulatory Visit: Payer: Medicaid Other | Admitting: Obstetrics & Gynecology

## 2023-09-30 ENCOUNTER — Encounter: Payer: Self-pay | Admitting: Obstetrics & Gynecology

## 2023-09-30 NOTE — Patient Instructions (Signed)
  Safe Medications in Pregnancy  that are over the counter  Acne:  Benzoyl Peroxide  Salicylic Acid  *NO-Retin A  Backache/Headache:  Tylenol: 2 Regular strength every 4 hours OR               2 Extra strength every 6 hours   Colds/Coughs/Allergies: (Congestion are bolded) Allegra Benadryl (alcohol free) 25 mg every 6 hours as needed  Breath right strips  Claritin  Cepacol throat lozenges  Chloraseptic throat spray  Cold-Eeze- up to three times per day  Cough drops, alcohol free  Flonase Guaifenesin  Mucinex (plain/DM) Nasacort Robitussin DM (plain only, alcohol free)  Saline nasal spray/drops Steroid nasal sprays Sudafed (Pseudoephedrine) & Actifed * use only after [redacted] weeks gestation and if you do not have high blood pressure  Tylenol  Vicks Vaporub  Zicam Zinc lozenges  Zyrtec   Make sure to not take anything that has the active ingredient Phenylephrine   Constipation:  Colace  Ducolax suppositories  Fleet enema  Glycerin suppositories  Metamucil  Milk of magnesia  Miralax  Senokot  Smooth move tea   Diarrhea:  Kaopectate  Imodium A-D   *NO Pepto Bismol   Hemorrhoids:  Anusol  Anusol-HC  Preparation-H  Tucks   Indigestion:  Tums  Maalox  Mylanta  Nexium Pepcid  Zantac Prevacid Protonix Prilosec  Insomnia:  Benadryl 25 - 50 mg every 6 hours as needed  Tylenol PM  Unisom  Leg Cramps:  Tums  Magnesium tablets  Nausea/Vomiting:  Bonine  Dramamine  Emetrol  Ginger extract  Sea bands  Meclizine   Nausea medication to take during pregnancy:  Unisom (doxylamine succinate 25 mg tablets) Take one tablet daily at bedtime. If symptoms are not adequately controlled, the dose can be increased to a maximum recommended dose of two tablets daily (1/2 tablet in the morning, 1/2 tablet mid-afternoon and one at bedtime).  Vitamin B6 100mg  tablets. Take one tablet twice a day (up to 200 mg per day).   Skin Rashes:  Aveeno products  Benadryl  cream or Benadryl tablets 25mg  every 6 hours as needed  Calamine Lotion  1% Hydrocortisone cream   Yeast infection:  Gyne-lotrimin 7  Monistat 3 or 7day   **If taking multiple medications, please check labels to avoid duplicating the same active ingredients  **Take medication as directed on the label ** ** Do not exceed 4000 mg of Tylenol/Acetaminophen in 24 hours**  **Do not take medications that contain Aspirin or Ibuprofen** Unless your doctor has prescribed low dose Aspirin for prevention of Pre-eclampsia

## 2023-09-30 NOTE — Progress Notes (Signed)
 Post Partum Visit Note  Amy Bond is a 23 y.o. G26P1011 female who presents for a postpartum visit. She is 6 weeks postpartum following a normal spontaneous vaginal delivery.  I have fully reviewed the prenatal and intrapartum course. The delivery was at 39 gestational weeks.  Anesthesia: epidural. Postpartum course has been uncomplicated. Baby is doing well. Baby is feeding by breast. Bleeding no bleeding. Bowel function is normal. Bladder function is normal. Patient is sexually active. Contraception method is none. Postpartum depression screening: negative.  The pregnancy intention screening data noted above was reviewed. Potential methods of contraception were discussed. The patient elected to proceed with FAM.   Edinburgh Postnatal Depression Scale - 09/30/23 1411       Edinburgh Postnatal Depression Scale:  In the Past 7 Days   I have been able to laugh and see the funny side of things. 0    I have looked forward with enjoyment to things. 0    I have blamed myself unnecessarily when things went wrong. 2    I have been anxious or worried for no good reason. 0    I have felt scared or panicky for no good reason. 0    Things have been getting on top of me. 1    I have been so unhappy that I have had difficulty sleeping. 0    I have felt sad or miserable. 1    I have been so unhappy that I have been crying. 0    The thought of harming myself has occurred to me. 0    Edinburgh Postnatal Depression Scale Total 4             Health Maintenance Due  Topic Date Due   HPV VACCINES (1 - 3-dose series) Never done   DTaP/Tdap/Td (1 - Tdap) Never done   COVID-19 Vaccine (1 - 2024-25 season) Never done    The following portions of the patient's history were reviewed and updated as appropriate: allergies, current medications, past family history, past medical history, past social history, past surgical history, and problem list.  Review of Systems Pertinent items noted in HPI and  remainder of comprehensive ROS otherwise negative.  Objective:  BP 102/68   Pulse 77   Wt 128 lb (58.1 kg)   LMP 11/07/2022   Breastfeeding Yes   BMI 21.30 kg/m   General:  alert and no distress   Breasts:  normal, done in presence of RN as chaperone  Lungs: clear to auscultation bilaterally  Heart:  regular rate and rhythm  Abdomen: soft, non-tender; bowel sounds normal; no masses,  no organomegaly   GU exam:  not indicated       Assessment:   Normal postpartum exam.   Plan:   Essential components of care per ACOG recommendations:  1.  Mood and well being: Patient with negative depression screening today. Reviewed local resources for support.  - Patient tobacco use? No.   - hx of drug use? No.    2. Infant care and feeding:  -Patient currently breastmilk feeding? Yes. Reviewed importance of draining breast regularly to support lactation.  -Social determinants of health (SDOH) reviewed in EPIC. No concerns.   3. Sexuality, contraception and birth spacing - Patient does not want a pregnancy in the next year.  Desired family size is 2 children.  - Reviewed reproductive life planning. Reviewed contraceptive methods based on pt preferences and effectiveness.  Patient desired FAM or LAM today.   Information about  other methods given to patient. - Discussed birth spacing of 18 months  4. Sleep and fatigue -Encouraged family/partner/community support of 4 hrs of uninterrupted sleep to help with mood and fatigue  5. Physical Recovery  - Discussed patients delivery and complications. She describes her labor as good. - Patient had a Vaginal, no problems at delivery. Patient had a 1st degree laceration. Perineal healing reviewed. Patient expressed understanding - Patient has urinary incontinence? No. - Patient is safe to resume physical and sexual activity  6.  Health Maintenance - HM due items addressed Yes - Too young for pap, return after 21st birthday. Also counseled about  HPV vaccine series.  -Breast Cancer screening indicated? No.    Jaynie Collins, MD [facility], [facility]

## 2023-11-03 ENCOUNTER — Telehealth (HOSPITAL_COMMUNITY): Payer: Self-pay | Admitting: Lactation Services

## 2023-11-03 NOTE — Telephone Encounter (Signed)
 Mother called inquiring about a breast pump.  She received a manual pump and wanted to know if the hospital could give her an DEBP.  Suggest mother call her insurance company.  Additionally provided mother with phone number of [employer].

## 2023-12-03 ENCOUNTER — Ambulatory Visit
Admission: EM | Admit: 2023-12-03 | Discharge: 2023-12-03 | Disposition: A | Discharge status: Home / Self Care | Attending: Emergency Medicine | Admitting: Emergency Medicine

## 2023-12-03 DIAGNOSIS — R079 Chest pain, unspecified: Secondary | ICD-10-CM | POA: Diagnosis not present

## 2023-12-03 DIAGNOSIS — J392 Other diseases of pharynx: Secondary | ICD-10-CM

## 2023-12-03 LAB — POCT RAPID STREP A (OFFICE): Rapid Strep A Screen: NEGATIVE

## 2023-12-03 NOTE — ED Provider Notes (Signed)
 Arlander Bellman    CSN: [id number] Arrival date & time: 12/03/23  1056      History   Chief Complaint Chief Complaint  Patient presents with   Mass    HPI Amy Bond is a 23 y.o. female.   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  Pain is described as sharp.  Occurring sporadically.  Believes she was told once that she had heart murmur, denies familial cardiac history.  Denies occurrence of chest pain, dizziness, lightheadedness, nausea, vomiting or visual disturbance.  Partner who is accompanying her to visit has same symptoms.  Patient endorses bumps inside of the throat noticed this morning.  Partner has nasal symptoms.  Denies pain with swallowing.  Endorses nasal congestion due to seasonal allergies, worse than normal.  Denies fever, cough, ear pain or fullness, headaches or sinus pressure. Has  Not attempted treatment.  Past Medical History:  Diagnosis Date   Anemia    Anxiety    Rh negative status during pregnancy 06/16/2022   Thalassemia alpha carrier 03/29/2023   UTI (urinary tract infection)     There are no active problems to display for this patient.   Past Surgical History:  Procedure Laterality Date   NO PAST SURGERIES      OB History     Gravida  2   Para  1   Term  1   Preterm      AB  1   Living  1      SAB  1   IAB      Ectopic      Multiple  0   Live Births  1            Home Medications    Prior to Admission medications   Medication Sig Start Date End Date Taking? Authorizing Provider  acetaminophen  (TYLENOL ) 500 MG tablet Take 2 tablets (1,000 mg total) by mouth every 6 (six) hours as needed. Patient not taking: Reported on 09/30/2023 08/20/23   Teena Feast, MD  furosemide  (LASIX ) 20 MG tablet Take 1 tablet (20 mg total) by mouth daily. Start 2/22 Patient not taking: Reported on 09/30/2023 08/21/23   Teena Feast, MD  ibuprofen  (ADVIL ) 800 MG tablet Take 1 tablet (800 mg total) by mouth every 6 (six) hours as needed. Patient not taking: Reported on 09/30/2023 08/20/23   Teena Feast, MD  Prenatal Vit-Fe Fumarate-FA (PRENATAL MULTIVITAMIN) TABS tablet Take 1 tablet by mouth daily at 12 noon.   Yes [provider]    Family History Family History  Problem Relation Age of Onset   Healthy Mother    Other Mother        2021- killed   Healthy Father    Diabetes Paternal Grandmother    Cancer Neg Hx    Asthma Neg Hx    Heart disease Neg Hx    Hypertension Neg Hx     Social History Social History   Tobacco Use   Smoking status: Never   Smokeless tobacco: Never   Tobacco comments:    Quit vaping May 2024  Vaping Use   Vaping status: Former   Substances: Nicotine, Flavoring  Substance Use Topics   Alcohol use: Never   Drug use: Never     Allergies   Patient has no known allergies.   Review of Systems Review of Systems   Physical Exam Triage Vital  Signs ED Triage Vitals  Encounter Vitals Group     BP 12/03/23 1106 112/76     Systolic BP Percentile --      Diastolic BP Percentile --      Pulse Rate 12/03/23 1106 76     Resp --      Temp 12/03/23 1106 98.2 F (36.8 C)     Temp Source 12/03/23 1106 Oral     SpO2 12/03/23 1106 99 %     Weight 12/03/23 1109 130 lb (59 kg)     Height 12/03/23 1109 5\' 5"  (1.651 m)     Head Circumference --      Peak Flow --      Pain Score 12/03/23 1108 4     Pain Loc --      Pain Education --      Exclude from Growth Chart --    No data found.  Updated Vital Signs BP 112/76 (BP Location: Left Arm)   Pulse 76   Temp 98.2 F (36.8 C) (Oral)   Ht 5\' 5"  (1.651 m)   Wt 130 lb (59 kg)   LMP 11/16/2023   SpO2 99%   Breastfeeding Yes   BMI 21.63 kg/m   Visual Acuity Right Eye Distance:   Left Eye Distance:   Bilateral Distance:    Right Eye Near:   Left Eye Near:    Bilateral Near:     Physical  Exam Constitutional:      Appearance: Normal appearance.  HENT:     Mouth/Throat:     Comments: 1 less than 0.5 cm papules present to the posterior oropharynx, no erythema or exudate noted, pharynx clear without obstruction Eyes:     Extraocular Movements: Extraocular movements intact.  Cardiovascular:     Rate and Rhythm: Normal rate and regular rhythm.     Pulses: Normal pulses.     Heart sounds: Normal heart sounds.     Comments: No tenderness, ecchymosis swelling or deformity to the chest wall, chest wall is symmetrical Pulmonary:     Effort: Pulmonary effort is normal.     Breath sounds: Normal breath sounds.  Neurological:     Mental Status: She is alert and oriented to person, place, and time.      UC Treatments / Results  Labs (all labs ordered are listed, but only abnormal results are displayed) Labs Reviewed  POCT RAPID STREP A (OFFICE) - Normal    EKG   Radiology No results found.  Procedures Procedures (including critical care time)  Medications Ordered in UC Medications - No data to display  Initial Impression / Assessment and Plan / UC Course  I have reviewed the triage vital signs and the nursing notes.  Pertinent labs & imaging results that were available during my care of the patient were reviewed by me and considered in my medical decision making (see chart for details).  Lesion of throat, chest pain  Rapid strep test negative, 1 lesion noted to the throat, no erythema or exudate, partner has same symptoms, declined STD testing, endorses monogamy, etiology most likely viral, advised to monitor and recommended supportive care  EKG shows PACs, discussed finding, not emergent, vital signs within normal limits,  stable, low suspicion for cardiac involvement, recommended supportive care and advised follow-up if symptoms continue to persist, walker referral given to cardiologist, for worsening symptoms instructed to go to the nearest emergency  department Final Clinical Impressions(s) / UC Diagnoses   Final diagnoses:  Chest pain,  unspecified type  Lesion of throat   Discharge Instructions      You were evaluated for the bump to the throat - On exam able to see a very small bump on the left side -Rapid strep test is negative for bacteria - As both you and your partner have the exact same symptoms it is most likely a viral germ and ideally should improve with time - May use ibuprofen  and/or Tylenol  as needed if pain occurs - May attempt salt water gargles, throat lozenges warm liquids and soft foods if pain occurs - If symptoms continue to persist or get worse please follow-up for reevaluation  For your chest pain - EKG does show that the heart is giving extra beats however this is a nonemergent rhythm and you may follow-up with a cardiologist whose information is on the front page if no improvement seen by Monday - At any point if your chest pain worsens in severity or becomes more prominent please go to the nearest emergency department for immediate evaluation and workup - Blood pressure and heart rate are within normal ranges -Normal heart sounds are heard when you are listening to - Attempted use of ibuprofen  and Tylenol  - Attempted use of warm compresses -Ensure that you are taking a medicine such as Mucinex for management of nasal congestion which could be going into the chest  ED Prescriptions   None    PDMP not reviewed this encounter.   Reena Canning, NP 12/03/23 1224

## 2023-12-03 NOTE — ED Triage Notes (Signed)
 Pt c/o bumps in the back of his throat x1day  Pt states that her boyfriend has the same bumps in his throat and was diagnosed with pleuritis and she is concerned due to having chest pain as well.  Pt states that she has a 51 month old baby and is concerned about being sick.  Pt recently changed the air filter in her home and believed there was mold on the filter and is unsure if that is causing her symptoms.   Pt denies pain in her throat  Pt states that she has a throbbing sharp pain in the middle of her chest that comes and goes starting this morning at 8am.  Pt states that she uses a lot of nasal sprays for her allergies and congestions.

## 2023-12-03 NOTE — Discharge Instructions (Signed)
 You were evaluated for the bump to the throat - On exam able to see a very small bump on the left side -Rapid strep test is negative for bacteria - As both you and your partner have the exact same symptoms it is most likely a viral germ and ideally should improve with time - May use ibuprofen  and/or Tylenol  as needed if pain occurs - May attempt salt water gargles, throat lozenges warm liquids and soft foods if pain occurs - If symptoms continue to persist or get worse please follow-up for reevaluation  For your chest pain - EKG does show that the heart is giving extra beats however this is a nonemergent rhythm and you may follow-up with a cardiologist whose information is on the front page if no improvement seen by Monday - At any point if your chest pain worsens in severity or becomes more prominent please go to the nearest emergency department for immediate evaluation and workup - Blood pressure and heart rate are within normal ranges -Normal heart sounds are heard when you are listening to - Attempted use of ibuprofen  and Tylenol  - Attempted use of warm compresses -Ensure that you are taking a medicine such as Mucinex for management of nasal congestion which could be going into the chest

## 2024-05-08 ENCOUNTER — Encounter: Payer: Self-pay | Admitting: Obstetrics and Gynecology

## 2024-05-08 ENCOUNTER — Ambulatory Visit

## 2024-05-08 DIAGNOSIS — Z3201 Encounter for pregnancy test, result positive: Secondary | ICD-10-CM

## 2024-05-08 DIAGNOSIS — N912 Amenorrhea, unspecified: Secondary | ICD-10-CM

## 2024-05-08 LAB — POCT URINE PREGNANCY: Preg Test, Ur: POSITIVE — AB

## 2024-05-08 NOTE — Progress Notes (Addendum)
 Amy Bond here for a UPT. Pt had a positive upt at home. LMP is 04/09/2024.   EDD:01/14/25 GA:108w1d  UPT in office Positive.   Samples provided.  Medication Samples have been provided to the patient.  Drug name: Vitalfol Gummies       Strength: N/A      Qty: 3  LOT: [id number]  Exp.Date: 08/27/2024  Dosing instructions: as directed  The patient has been instructed regarding the correct time, dose, and frequency of taking this medication, including desired effects and most common side effects.   [name] 4:38 PM 05/08/2024    Reviewed medications and informed to start a PNV, if not already. Pt to follow up in 4 weeks for New OB visit.

## 2024-05-23 ENCOUNTER — Encounter: Payer: Self-pay | Admitting: Obstetrics and Gynecology

## 2024-05-23 ENCOUNTER — Inpatient Hospital Stay (HOSPITAL_COMMUNITY)

## 2024-05-23 ENCOUNTER — Inpatient Hospital Stay (HOSPITAL_COMMUNITY)
Admission: AD | Admit: 2024-05-23 | Discharge: 2024-05-23 | Disposition: A | Discharge status: Home / Self Care | Attending: Obstetrics and Gynecology | Admitting: Obstetrics and Gynecology

## 2024-05-23 ENCOUNTER — Encounter (HOSPITAL_COMMUNITY): Payer: Self-pay | Admitting: Obstetrics and Gynecology

## 2024-05-23 DIAGNOSIS — Z3A01 Less than 8 weeks gestation of pregnancy: Secondary | ICD-10-CM | POA: Insufficient documentation

## 2024-05-23 DIAGNOSIS — R1022 Pelvic and perineal pain left side: Secondary | ICD-10-CM | POA: Diagnosis not present

## 2024-05-23 DIAGNOSIS — O26891 Other specified pregnancy related conditions, first trimester: Secondary | ICD-10-CM | POA: Insufficient documentation

## 2024-05-23 DIAGNOSIS — R102 Pelvic and perineal pain unspecified side: Secondary | ICD-10-CM

## 2024-05-23 DIAGNOSIS — R1032 Left lower quadrant pain: Secondary | ICD-10-CM | POA: Diagnosis present

## 2024-05-23 LAB — CBC
HCT: 31.4 % — ABNORMAL LOW (ref 36.0–46.0)
Hemoglobin: 9.8 g/dL — ABNORMAL LOW (ref 12.0–15.0)
MCH: 22.1 pg — ABNORMAL LOW (ref 26.0–34.0)
MCHC: 31.2 g/dL (ref 30.0–36.0)
MCV: 70.9 fL — ABNORMAL LOW (ref 80.0–100.0)
Platelets: 322 K/uL (ref 150–400)
RBC: 4.43 MIL/uL (ref 3.87–5.11)
RDW: 14.7 % (ref 11.5–15.5)
WBC: 5.5 K/uL (ref 4.0–10.5)
nRBC: 0 % (ref 0.0–0.2)

## 2024-05-23 LAB — URINALYSIS, ROUTINE W REFLEX MICROSCOPIC
Bacteria, UA: NONE SEEN
Bilirubin Urine: NEGATIVE
Glucose, UA: NEGATIVE mg/dL
Hgb urine dipstick: NEGATIVE
Ketones, ur: NEGATIVE mg/dL
Nitrite: NEGATIVE
Protein, ur: NEGATIVE mg/dL
Specific Gravity, Urine: 1.024 (ref 1.005–1.030)
pH: 6 (ref 5.0–8.0)

## 2024-05-23 LAB — WET PREP, GENITAL
Clue Cells Wet Prep HPF POC: NONE SEEN
Sperm: NONE SEEN
Trich, Wet Prep: NONE SEEN
WBC, Wet Prep HPF POC: >=10 — AB (ref <10)
Yeast Wet Prep HPF POC: NONE SEEN

## 2024-05-23 LAB — HCG, QUANTITATIVE, PREGNANCY: hCG, Beta Chain, Quant, S: 12584 m[IU]/mL — ABNORMAL HIGH (ref <5)

## 2024-05-23 MED ORDER — ACETAMINOPHEN 500 MG PO TABS
1000.0000 mg | ORAL_TABLET | Freq: Once | ORAL | Status: AC
  Administered 2024-05-23: 1000 mg via ORAL
  Filled 2024-05-23: qty 2

## 2024-05-23 NOTE — MAU Note (Signed)
 Amy Bond is a 23 y.o. at Unknown here in MAU reporting: woke up yesterday with pain on her left side. As the day went on the pain was not as intense but still had difficulty having a BM. Pain continues today . Denies any vag bleeding or discharge.   LMP:  Onset of complaint: yesterday Pain score: 4-5 Vitals:   05/23/24 1742  BP: 121/73  Pulse: 77  Resp: 18  Temp: 98.8 F (37.1 C)     FHT: n/.a  Lab orders placed from triage: u/a, wet prep, gc.

## 2024-05-23 NOTE — MAU Provider Note (Signed)
 History     CSN: [id number]  Arrival date and time: 05/23/24 1720   Event Date/Time   First Provider Initiated Contact with Patient 05/23/24 2137      Chief Complaint  Patient presents with   Pelvic Pain   Amy Bond , a  23 y.o. G3P1011 at Unknown presents to MAU with complaints of left sided pelvic pain. Patient states that the pain started yesterday morning. Patient reports so painful it woke her up out of her sleep. Currently rates pain as a 4-5/10. Denies attempting to relieve symptoms. Patient denies abnormal vaginal discharge, vaginal bleeding, and urinary symptoms.          OB History     Gravida  3   Para  1   Term  1   Preterm      AB  1   Living  1      SAB  1   IAB      Ectopic      Multiple  0   Live Births  1           Past Medical History:  Diagnosis Date   Anemia    Anxiety    Rh negative status during pregnancy 06/16/2022   Thalassemia alpha carrier 03/29/2023   UTI (urinary tract infection)     Past Surgical History:  Procedure Laterality Date   NO PAST SURGERIES      Family History  Problem Relation Age of Onset   Healthy Mother    Other Mother        2021- killed   Healthy Father    Diabetes Paternal Grandmother    Cancer Neg Hx    Asthma Neg Hx    Heart disease Neg Hx    Hypertension Neg Hx     Social History   Tobacco Use   Smoking status: Never   Smokeless tobacco: Never   Tobacco comments:    Quit vaping May 2024  Vaping Use   Vaping status: Former   Substances: Nicotine, Flavoring  Substance Use Topics   Alcohol use: Never   Drug use: Never    Allergies: No Known Allergies  Medications Prior to Admission  Medication Sig Dispense Refill Last Dose/Taking   acetaminophen  (TYLENOL ) 500 MG tablet Take 2 tablets (1,000 mg total) by mouth every 6 (six) hours as needed. (Patient not taking: Reported on 09/30/2023) 30 tablet 0    furosemide  (LASIX ) 20 MG tablet Take 1 tablet (20 mg total) by  mouth daily. Start 2/22 (Patient not taking: Reported on 09/30/2023) 4 tablet 0    ibuprofen  (ADVIL ) 800 MG tablet Take 1 tablet (800 mg total) by mouth every 6 (six) hours as needed. (Patient not taking: Reported on 09/30/2023) 30 tablet 0    Prenatal Vit-Fe Fumarate-FA (PRENATAL MULTIVITAMIN) TABS tablet Take 1 tablet by mouth daily at 12 noon.       Review of Systems  Constitutional:  Negative for chills, fatigue and fever.  Eyes:  Negative for pain and visual disturbance.  Respiratory:  Negative for apnea, shortness of breath and wheezing.   Cardiovascular:  Negative for chest pain and palpitations.  Gastrointestinal:  Positive for abdominal pain. Negative for constipation, diarrhea, nausea and vomiting.  Genitourinary:  Negative for difficulty urinating, dysuria, pelvic pain, vaginal bleeding, vaginal discharge and vaginal pain.  Musculoskeletal:  Negative for back pain.  Neurological:  Negative for seizures, weakness and headaches.  Psychiatric/Behavioral:  Negative for suicidal ideas.    Physical  Exam   Blood pressure 121/73, pulse 77, temperature 98.8 F (37.1 C), resp. rate 18, height 5' 5 (1.651 m), weight 62.1 kg, last menstrual period 04/09/2024, currently breastfeeding.  Physical Exam Vitals and nursing note reviewed.  Constitutional:      General: She is not in acute distress.    Appearance: Normal appearance.  HENT:     Head: Normocephalic.  Pulmonary:     Effort: Pulmonary effort is normal.  Abdominal:     Palpations: Abdomen is soft.     Tenderness: There is no abdominal tenderness.  Musculoskeletal:     Cervical back: Normal range of motion.  Skin:    General: Skin is warm and dry.  Neurological:     Mental Status: She is alert and oriented to person, place, and time.  Psychiatric:        Mood and Affect: Mood normal.     MAU Course  Procedures Orders Placed This Encounter  Procedures   Wet prep, genital   US  OB LESS THAN 14 WEEKS WITH OB TRANSVAGINAL    Urinalysis, Routine w reflex microscopic -Urine, Clean Catch   CBC   hCG, quantitative, pregnancy   Diet NPO time specified   ABO/Rh   Discharge patient Discharge disposition: 01-Home or Self Care; Discharge patient date: 05/23/2024  ' Meds ordered this encounter  Medications   acetaminophen  (TYLENOL ) tablet 1,000 mg   Results for orders placed or performed during the hospital encounter of 05/23/24 (from the past 24 hours)  CBC     Status: Abnormal   Collection Time: 05/23/24  6:22 PM  Result Value Ref Range   WBC 5.5 4.0 - 10.5 K/uL   RBC 4.43 3.87 - 5.11 MIL/uL   Hemoglobin 9.8 (L) 12.0 - 15.0 g/dL   HCT 68.5 (L) 63.9 - 53.9 %   MCV 70.9 (L) 80.0 - 100.0 fL   MCH 22.1 (L) 26.0 - 34.0 pg   MCHC 31.2 30.0 - 36.0 g/dL   RDW 85.2 88.4 - 84.4 %   Platelets 322 150 - 400 K/uL   nRBC 0.0 0.0 - 0.2 %  ABO/Rh     Status: None   Collection Time: 05/23/24  6:22 PM  Result Value Ref Range   ABO/RH(D)      O NEG Performed at [facility], [address], [place], [place] [address]   hCG, quantitative, pregnancy     Status: Abnormal   Collection Time: 05/23/24  6:22 PM  Result Value Ref Range   hCG, Beta Chain, Quant, S 12,584 (H) <5 mIU/mL  Urinalysis, Routine w reflex microscopic -Urine, Clean Catch     Status: Abnormal   Collection Time: 05/23/24  6:32 PM  Result Value Ref Range   Color, Urine YELLOW YELLOW   APPearance HAZY (A) CLEAR   Specific Gravity, Urine 1.024 1.005 - 1.030   pH 6.0 5.0 - 8.0   Glucose, UA NEGATIVE NEGATIVE mg/dL   Hgb urine dipstick NEGATIVE NEGATIVE   Bilirubin Urine NEGATIVE NEGATIVE   Ketones, ur NEGATIVE NEGATIVE mg/dL   Protein, ur NEGATIVE NEGATIVE mg/dL   Nitrite NEGATIVE NEGATIVE   Leukocytes,Ua TRACE (A) NEGATIVE   RBC / HPF 0-5 0 - 5 RBC/hpf   WBC, UA 0-5 0 - 5 WBC/hpf   Bacteria, UA NONE SEEN NONE SEEN   Squamous Epithelial / HPF 0-5 0 - 5 /HPF   Mucus PRESENT   Wet prep, genital     Status: Abnormal   Collection Time:  05/23/24  6:34 PM   Specimen: PATH Cytology Cervicovaginal Ancillary Only  Result Value Ref Range   Yeast Wet Prep HPF POC NONE SEEN NONE SEEN   Trich, Wet Prep NONE SEEN NONE SEEN   Clue Cells Wet Prep HPF POC NONE SEEN NONE SEEN   WBC, Wet Prep HPF POC >=10 (A) <10   Sperm NONE SEEN   US  OB LESS THAN 14 WEEKS WITH OB TRANSVAGINAL Result Date: 05/23/2024 EXAM: ULTRASOUND FIRST TRIMESTER TECHNIQUE: Transabdominal and Transvaginal first trimester obstetric pelvic duplex ultrasound was performed with real-time imaging, color flow Doppler imaging, and spectral analysis. COMPARISON: None available. CLINICAL HISTORY: Left lower quadrant pain for 1 day. Positive urine pregnancy test. Estimated gestational age by LMP is 6 weeks 2 days. FINDINGS: UTERUS: No focal myometrial mass. GESTATIONAL SAC(S): A single intrauterine sac is demonstrated. Mean sac diameter measures 11.3 mm consistent with estimated gestational age of [redacted] weeks 6 days. No subchorionic hemorrhage. YOLK SAC: Present EMBRYO(<11WK) /FETUS(>=11WK): Fetal pole and fetal cardiac activity are not observed. CROWN RUMP LENGTH: Not measured RATE OF CARDIAC ACTIVITY: Not measured. RIGHT OVARY: Unremarkable. Normal arterial and venous flow. LEFT OVARY: Unremarkable. Normal arterial and venous flow. FREE FLUID: No free fluid. MEASUREMENTS ESTIMATED GESTATIONAL AGE BY CURRENT ULTRASOUND: 5 weeks 6 days ESTIMATED GESTATIONAL AGE BY LMP/PRIOR ULTRASOUND: 6 weeks 2 days ESTIMATED DUE DATE: IMPRESSION: 1. Intrauterine pregnancy with yolk sac, mean sac diameter consistent with approximately 5 weeks 6 days gestation. 2. No fetal pole or fetal cardiac activity identified, which may be within normal limits at this gestational age; recommend short-interval follow-up ultrasound to assess for development of an embryo and cardiac activity. Electronically signed by: Elsie Gravely MD 05/23/2024 07:45 PM EST RP Workstation: [id number]     MDM - PO tylenol  given for  Pain.  - Wet prep normal  - HCG 12,584  - UA hazy with traces leuks but otherwise normal. Low suspicion for UTI  - US  revealed a IUP measuring ~ 5 weeks 6 days.  - plan for discharge  Assessment and Plan   1. Pelvic pain   2. [redacted] weeks gestation of pregnancy    - Reviewed that cramping can be a normal discomfort of pregnancy.  - Reviewed worsening signs and return precautions  - Patient has a NOB appt scheduled for 12/9. Encouraged to keep appt.  - Pt discharged home in stable condition and may return to MAU as needed.   Amy CHRISTELLA Cedar, MSN CNM  05/23/2024, 9:37 PM

## 2024-05-24 LAB — ABO/RH
ABO/RH(D): O NEG
Antibody Screen: NEGATIVE

## 2024-05-24 LAB — GC/CHLAMYDIA PROBE AMP (~~LOC~~) NOT AT ARMC
Chlamydia: NEGATIVE
Comment: NEGATIVE
Comment: NORMAL
Neisseria Gonorrhea: NEGATIVE

## 2024-06-06 ENCOUNTER — Ambulatory Visit (INDEPENDENT_AMBULATORY_CARE_PROVIDER_SITE_OTHER): Admitting: *Deleted

## 2024-06-06 ENCOUNTER — Other Ambulatory Visit (INDEPENDENT_AMBULATORY_CARE_PROVIDER_SITE_OTHER): Payer: Self-pay

## 2024-06-06 VITALS — BP 128/78 | HR 120 | Wt 138.0 lb

## 2024-06-06 DIAGNOSIS — O3680X0 Pregnancy with inconclusive fetal viability, not applicable or unspecified: Secondary | ICD-10-CM

## 2024-06-06 DIAGNOSIS — Z3A01 Less than 8 weeks gestation of pregnancy: Secondary | ICD-10-CM

## 2024-06-06 DIAGNOSIS — Z8742 Personal history of other diseases of the female genital tract: Secondary | ICD-10-CM | POA: Insufficient documentation

## 2024-06-06 DIAGNOSIS — Z3481 Encounter for supervision of other normal pregnancy, first trimester: Secondary | ICD-10-CM | POA: Diagnosis not present

## 2024-06-06 DIAGNOSIS — Z6791 Unspecified blood type, Rh negative: Secondary | ICD-10-CM | POA: Insufficient documentation

## 2024-06-06 DIAGNOSIS — Z3480 Encounter for supervision of other normal pregnancy, unspecified trimester: Secondary | ICD-10-CM | POA: Insufficient documentation

## 2024-06-06 NOTE — Progress Notes (Cosign Needed Addendum)
 New OB Intake  I explained I am completing New OB Intake today. We discussed EDD of 01/24/2025, by Ultrasound. Pt is G3P1011. I reviewed her allergies, medications and Medical/Surgical/OB history.    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  Patient also informed that the ultrasound is not being completed with the intent of assessing for fetal or placental anomalies or any pelvic abnormalities. Explained that the purpose of today's ultrasound is to assess for viability.  Patient acknowledges the purpose of the exam and the limitations of the study.     Delivery Plans Plans to deliver at [facility]. Discussed the nature of our practice with multiple providers including residents and students. Due to the size of the practice, the delivering provider may not be the same as those providing prenatal care.   MyChart/Babyscripts MyChart access verified. I explained pt will have some visits in office and some virtually. Babyscripts app discussed and ordered.   Blood Pressure Cuff Blood pressure cuff discussed and has one from last pregnancy.Discussed to be used for virtual visits and or if needed BP checks weekly.  Anatomy US  Explained first scheduled US  will be around 19 weeks. Order today.  Last Pap Needs pap at [facility]  First visit review I reviewed new OB appt with patient. Explained pt will be seen by Rolitta CNM at first visit. Discussed Jennell genetic screening with patient will get panorama at [facility]. Routine prenatal labs to be collected at new OB.    Wanda Buckles, RN 06/06/2024  2:20 PM

## 2024-06-07 ENCOUNTER — Encounter: Payer: Self-pay | Admitting: Obstetrics and Gynecology

## 2024-06-07 ENCOUNTER — Other Ambulatory Visit: Payer: Self-pay | Admitting: *Deleted

## 2024-06-07 MED ORDER — METOCLOPRAMIDE HCL 10 MG PO TABS: 10.0000 mg | ORAL_TABLET | Freq: Four times a day (QID) | ORAL | 2 refills | Status: AC | PRN

## 2024-06-28 ENCOUNTER — Encounter: Admitting: Obstetrics and Gynecology

## 2024-07-05 ENCOUNTER — Other Ambulatory Visit (HOSPITAL_COMMUNITY)
Admission: RE | Admit: 2024-07-05 | Discharge: 2024-07-05 | Disposition: A | Source: Ambulatory Visit | Attending: Obstetrics and Gynecology | Admitting: Obstetrics and Gynecology

## 2024-07-05 ENCOUNTER — Encounter: Payer: Self-pay | Admitting: Obstetrics and Gynecology

## 2024-07-05 ENCOUNTER — Ambulatory Visit (INDEPENDENT_AMBULATORY_CARE_PROVIDER_SITE_OTHER): Admitting: Obstetrics and Gynecology

## 2024-07-05 VITALS — BP 116/76 | HR 91 | Wt 134.0 lb

## 2024-07-05 DIAGNOSIS — O26891 Other specified pregnancy related conditions, first trimester: Secondary | ICD-10-CM | POA: Diagnosis not present

## 2024-07-05 DIAGNOSIS — Z3A11 11 weeks gestation of pregnancy: Secondary | ICD-10-CM

## 2024-07-05 DIAGNOSIS — O210 Mild hyperemesis gravidarum: Secondary | ICD-10-CM | POA: Diagnosis not present

## 2024-07-05 DIAGNOSIS — Z6791 Unspecified blood type, Rh negative: Secondary | ICD-10-CM

## 2024-07-05 DIAGNOSIS — Z3480 Encounter for supervision of other normal pregnancy, unspecified trimester: Secondary | ICD-10-CM

## 2024-07-05 DIAGNOSIS — O09899 Supervision of other high risk pregnancies, unspecified trimester: Secondary | ICD-10-CM

## 2024-07-05 DIAGNOSIS — O99011 Anemia complicating pregnancy, first trimester: Secondary | ICD-10-CM

## 2024-07-05 DIAGNOSIS — O99019 Anemia complicating pregnancy, unspecified trimester: Secondary | ICD-10-CM

## 2024-07-05 DIAGNOSIS — O09891 Supervision of other high risk pregnancies, first trimester: Secondary | ICD-10-CM | POA: Diagnosis not present

## 2024-07-05 MED ORDER — ONDANSETRON 4 MG PO TBDP: 4.0000 mg | ORAL_TABLET | Freq: Three times a day (TID) | ORAL | 0 refills | Status: DC | PRN

## 2024-07-05 MED ORDER — ASPIRIN 81 MG PO CHEW: 81.0000 mg | CHEWABLE_TABLET | Freq: Every day | ORAL | 6 refills | Status: DC

## 2024-07-05 NOTE — Patient Instructions (Signed)
 Pregnancy: Healthy Eating While you're pregnant, your body needs extra nutrition for your growing baby. You also need more vitamins and minerals, such as folic acid, calcium, iron, and vitamin D. Eating a balanced diet is important for both you and your baby. Your need for extra calories will change during pregnancy. During the first 3 months of pregnancy, called the first trimester, you don't need more calories. During the second trimester, you'll need about 340 extra calories a day. During the third trimester, you'll need about 450 extra calories a day. If you're carrying more than one baby, talk with your health care provider or a dietitian to learn more about your specific eating needs. What are tips for eating healthy during pregnancy? Meal planning  Eating smaller meals throughout the day may help manage some side effects common in pregnancy, like heartburn and reflux. Eat a variety of foods. Be sure to include many types of fruits and vegetables. Two or more servings of fish are recommended each week. Choose fish that are lower in mercury, such as salmon and pollock. Limit foods that have empty calories. These are foods that have little nutritional value, such as sweets, desserts, candies, and drinks with sugar in them. Drinks that have caffeine are OK to drink, but it's better to avoid caffeine. Limit your total caffeine intake to less than 200 mg each day, or the limit you're told by your provider. Be aware that 200 mg of caffeine is 12 oz or 355 mL of coffee, tea, or soda. General information Take a prenatal vitamin to help meet your vitamin and mineral needs during pregnancy. This includes your need for folic acid, iron, calcium, and vitamin D. Do not try to lose weight or go on a diet during pregnancy. Food safety  Wash your hands before you eat and after you prepare raw meat. Wash all fruits and vegetables well before peeling or eating. Make sure that all meats, poultry, and  eggs are cooked to food-safe temperatures or well-done. Taking these actions can help keep your food safe and protect you and your baby from dangerous food illnesses. Ask your provider for more information. What foods should I eat? Fruits All fruits. Eat a variety of colors and types of fruit. Remember to wash your fruits well before peeling or eating. Vegetables All vegetables. Eat a variety of colors and types of vegetables. Remember to wash your vegetables well before peeling or eating. Grains All grains. Choose whole grains, such as whole-wheat bread, oatmeal, or brown rice. Meats and other protein foods Lean meats, including chicken, [place], and lean cuts of beef, veal, or pork. Fish that is higher in omega-3 fatty acids and lower in mercury, such as salmon, herring, mussels, trout, sardines, pollock, shrimp, crab, and lobster. Tofu. Tempeh. Beans. Eggs. Peanut butter and other nut butters. Dairy Pasteurized milk and milk alternatives, such as soy milk. Pasteurized yogurt and pasteurized cheese. Cottage cheese. Sour cream. Beverages Water. Juices that contain 100% fruit juice or vegetable juice. Caffeine-free teas and decaffeinated coffee. Fats and oils Fats and oils are OK to include in moderation. Sweets and desserts Sweets and desserts are OK to include in moderation. Seasoning and other foods All pasteurized condiments. The items listed above may not be all the foods and drinks you can have. Talk with a dietitian to learn more. What does 340 extra calories look like? Healthy snacks that give you 340 more calories a day could be: Peanut butter and jelly with milk: 8 oz (237 mL) of  low-fat milk. Peanut butter and jelly sandwich made with: 1 slice of whole-wheat bread. 2 teaspoons (10 g) of peanut butter. Yogurt and berries: 1 cup (245 g) of [place] yogurt. 1 cup (150 g) of berries. 2 tablespoons (30 g) of chopped nuts, such as almonds or walnuts. Avocado toast: 1 slice of  whole-wheat bread. 1/2 medium avocado (70 g). 1 large egg (50 g). What foods should I avoid? Fruits Raw (unpasteurized) fruit juices. Vegetables Unpasteurized vegetable juices. Meats and other protein foods Precooked or cured meat, such as bologna, hot dogs, sausages, or meat loaves. (If you must eat those meats, reheat them until they are steaming hot.) Refrigerated pate, meat spreads from a meat counter, or smoked seafood that's found in the refrigerated section of a store. Raw or undercooked meats, poultry, and eggs. Raw fish, such as sushi or sashimi. Fish that have high mercury content, such as tilefish, shark, swordfish, and king mackerel. Dairy Unpasteurized or raw milk and any foods that are made from them. Some of these may be: Homemade yogurts or puddings. Soft cheeses such as: Feta. Queso blanco or fresco. Pharmacist, hospital or [place]. Blue-veined cheeses. Some of these types of cheeses may be made with pasteurized milk. Check the label. If pasteurized milk is used, they are OK to eat during pregnancy. Deli foods Premade foods from a store or deli, like chicken salad, coleslaw, or egg salad. These are riskier for food illness than fresh or homemade salads. Beverages Alcohol. Sugar-sweetened drinks, such as sodas or teas. Energy drinks. Seasoning and other foods Homemade fermented foods and drinks, such as: Pickles. Sauerkraut. Kombucha. Store-bought pasteurized versions of these are OK. The items listed above may not be all the foods and drinks you should avoid. Talk with a dietitian to learn more. Where to find more information To learn more, go to: Centers for Disease Control and Prevention at TonerPromos.no. Click Search and type food choices for pregnancy. Find the link you need. MyPlate at http://pittman-dennis.biz/. This information is not intended to replace advice given to you by your health care provider. Make sure you discuss any questions you have with your health care  provider. Document Revised: 06/02/2023 Document Reviewed: 06/02/2023 Elsevier Patient Education  2025 [employer].

## 2024-07-05 NOTE — Progress Notes (Signed)
 "   INITIAL OBSTETRICAL VISIT Patient name: Amy Bond MRN [id number]  Date of birth: [birth year] Chief Complaint:   Initial Prenatal Visit  History of Present Illness:   Amy Bond is a 24 y.o. G76P1011 African American female at [redacted]w[redacted]d by 6 wk U/S with an Estimated Date of Delivery: 01/24/25 being seen today for her initial obstetrical visit.  Her obstetrical history is significant for short interval between pregnancies. This is an unplanned pregnancy, but desired. She and the father of the baby (FOB) Zac live together. She has a support system that consists of the FOB/her family/friends. Today she reports nausea and vomiting. Requesting a different anti-emetic, because the Reglan  is making her sleepy.  Patient's last menstrual period was 04/09/2024. Last pap never had. Review of Systems:   Pertinent items are noted in HPI Denies cramping/contractions, leakage of fluid, vaginal bleeding, abnormal vaginal discharge w/ itching/odor/irritation, headaches, visual changes, shortness of breath, chest pain, abdominal pain, severe nausea/vomiting, or problems with urination or bowel movements unless otherwise stated above.  Pertinent History Reviewed:  Reviewed past medical,surgical, social, obstetrical and family history.  Reviewed problem list, medications and allergies. OB History  Gravida Para Term Preterm AB Living  3 1 1  1 1   SAB IAB Ectopic Multiple Live Births  1   0 1    # Outcome Date GA Lbr Len/2nd Weight Sex Type Anes PTL Lv  3 Current           2 Term 08/18/23 [redacted]w[redacted]d 20:12 / 00:24 6 lb 7 oz (2.92 kg) F Vag-Spont EPI  LIV     Birth Comments: WDL  1 SAB [birth year]           Physical Assessment:   Vitals:   07/05/24 1512  BP: 116/76  Pulse: 91  Weight: 134 lb (60.8 kg)  Body mass index is 22.3 kg/m.       Physical Examination:  General appearance - well appearing, and in no distress  Mental status - alert, oriented to person, place, and time  Psych:  She has a normal  mood and affect  Skin - warm and dry, normal color, no suspicious lesions noted  Chest - effort normal, all lung fields clear to auscultation bilaterally  Heart - normal rate and regular rhythm  Abdomen - soft, nontender  Extremities:  No swelling or varicosities noted Pelvic - deferred -- patient declined pap - Swabs collected by patient using blind swab technique   FHTs by doppler: 150 bpm  Assessment & Plan:  1) Low-Risk Pregnancy G3P1011 at [redacted]w[redacted]d with an Estimated Date of Delivery: 01/24/25   2) Initial OB visit - Welcomed to practice and introduced self to patient in addition to discussing other advanced practice providers that she may be seeing at this practice - Congratulated patient - Anticipatory guidance on upcoming appointments - Educated on COVID19 and pregnancy and the integration of virtual appointments  - Educated on babyscripts app- patient reports she has not received email, encouraged to look in spam folder and to call office if she still has not received email - patient verbalizes understanding    3) Supervision of other normal pregnancy, antepartum (Primary) - Doing well - Prescription for: Zofran  4 mg ODT every 8 hrs  4) Short interval between pregnancies affecting pregnancy, antepartum  5) Rh negative state in antepartum period  6) [redacted] weeks gestation of pregnancy     Meds:  Meds ordered this encounter  Medications   ondansetron  (ZOFRAN -ODT) 4 MG  disintegrating tablet    Sig: Take 1 tablet (4 mg total) by mouth every 8 (eight) hours as needed for nausea or vomiting.    Dispense:  20 tablet    Refill:  0    Supervising Provider:   PRATT, TANYA S [[id number]]   DISCONTD: aspirin  81 MG chewable tablet    Sig: Chew 1 tablet (81 mg total) by mouth daily.    Dispense:  30 tablet    Refill:  6    Supervising Provider:   PRATT, TANYA S [[id number]]    Initial labs obtained Continue prenatal vitamins Reviewed n/v relief measures and warning s/s to report Reviewed  recommended weight gain based on pre-gravid BMI Encouraged well-balanced diet Genetic Screening discussed: ordered Cystic fibrosis, SMA, Fragile X screening discussed ordered The nature of [facility] with multiple MDs and other Advanced Practice Providers was explained to patient; also emphasized that residents, students are part of our team.  Discussed optimized OB schedule and video visits. Advised can have an in-office visit whenever she feels she needs to be seen.  Does have own BP cuff. Check BP weekly, report BP >140/90. Advised to call during normal business hours and there is an after-hours nurse line available.   Indications for ASA therapy (per uptodate) One of the following: Previous pregnancy with preeclampsia, especially early onset and with an adverse outcome? No Multifetal gestation? No Chronic hypertension? No Type 1 or 2 diabetes mellitus? No Chronic kidney disease? No Autoimmune disease (antiphospholipid syndrome, systemic lupus erythematosus)? No   Two or more of the following: Nulliparity? No Obesity (body mass index >30 kg/m2)? No Family history of preeclampsia in mother or sister? No Age >=35 years? No Sociodemographic characteristics (African American race, low socioeconomic level)? Yes Personal risk factors (eg, previous pregnancy with low birth weight or small for gestational age infant, previous adverse pregnancy outcome [eg, stillbirth], interval >10 years between pregnancies)? No   Indications for early GDM screening  First-degree relative with diabetes? No BMI >30kg/m2? No Age > 35 years? No Previous birth of an infant weighing >=4000 g? No Gestational diabetes mellitus in a previous pregnancy? No Glycated hemoglobin >=5.7 percent (39 mmol/mol), impaired glucose tolerance, or impaired fasting glucose on previous testing? No High-risk race/ethnicity (eg, African American, Latino, Native [address], Asian American, Pacific  Islander)? Yes Previous stillbirth of unknown cause? No Maternal birthweight > 9 lbs? No History of cardiovascular disease? No Hypertension or on therapy for hypertension? No High-density lipoprotein cholesterol level <35 mg/dL (9.09 mmol/L) and/or a triglyceride level >250 mg/dL (7.17 mmol/L)? No Polycystic ovarian syndrome (PCOS)? No Physical inactivity? Yes Other clinical condition associated with insulin resistance (eg, severe obesity, acanthosis nigricans)? No Current use of glucocorticoids? No  Follow-up: Return in about 4 weeks (around 08/02/2024) for Return OB visit.   Orders Placed This Encounter  Procedures   Culture, OB Urine   CBC/D/Plt+RPR+Rh+ABO+RubIgG...   PANORAMA PRENATAL TEST   Hemoglobin A1c   SPECIAL REPORTS OR FORMS [id number]    Ala Cart MSN, CNM 07/05/2024 3:08 PM  "

## 2024-07-06 LAB — CBC/D/PLT+RPR+RH+ABO+RUBIGG...
Antibody Screen: NEGATIVE
Basophils Absolute: 0 x10E3/uL (ref 0.0–0.2)
Basos: 0 %
EOS (ABSOLUTE): 0.1 x10E3/uL (ref 0.0–0.4)
Eos: 1 %
HCV Ab: NONREACTIVE
HIV Screen 4th Generation wRfx: NONREACTIVE
Hematocrit: 36.9 % (ref 34.0–46.6)
Hemoglobin: 11.3 g/dL (ref 11.1–15.9)
Hepatitis B Surface Ag: NEGATIVE
Immature Grans (Abs): 0 x10E3/uL (ref 0.0–0.1)
Immature Granulocytes: 0 %
Lymphocytes Absolute: 0.8 x10E3/uL (ref 0.7–3.1)
Lymphs: 8 %
MCH: 22.3 pg — ABNORMAL LOW (ref 26.6–33.0)
MCHC: 30.6 g/dL — ABNORMAL LOW (ref 31.5–35.7)
MCV: 73 fL — ABNORMAL LOW (ref 79–97)
Monocytes Absolute: 0.6 x10E3/uL (ref 0.1–0.9)
Monocytes: 6 %
Neutrophils Absolute: 9.2 x10E3/uL — ABNORMAL HIGH (ref 1.4–7.0)
Neutrophils: 85 %
Platelets: 330 x10E3/uL (ref 150–450)
RBC: 5.07 x10E6/uL (ref 3.77–5.28)
RDW: 15.8 % — ABNORMAL HIGH (ref 11.7–15.4)
RPR Ser Ql: NONREACTIVE
Rh Factor: NEGATIVE
Rubella Antibodies, IGG: 0.95 {index} — ABNORMAL LOW
WBC: 10.7 x10E3/uL (ref 3.4–10.8)

## 2024-07-06 LAB — HCV INTERPRETATION

## 2024-07-06 LAB — HEMOGLOBIN A1C
Est. average glucose Bld gHb Est-mCnc: 108 mg/dL
Hgb A1c MFr Bld: 5.4 % (ref 4.8–5.6)

## 2024-07-07 LAB — URINE CULTURE, OB REFLEX

## 2024-07-07 LAB — CULTURE, OB URINE

## 2024-07-10 LAB — CERVICOVAGINAL ANCILLARY ONLY
Chlamydia: NEGATIVE
Comment: NEGATIVE
Comment: NORMAL
Neisseria Gonorrhea: NEGATIVE

## 2024-07-13 ENCOUNTER — Ambulatory Visit: Payer: Self-pay | Admitting: Obstetrics and Gynecology

## 2024-07-13 LAB — PANORAMA PRENATAL TEST FULL PANEL:PANORAMA TEST PLUS 5 ADDITIONAL MICRODELETIONS: FETAL FRACTION: 9

## 2024-08-02 ENCOUNTER — Encounter: Admitting: Certified Nurse Midwife

## 2024-08-02 VITALS — BP 114/77 | HR 123 | Wt 131.0 lb

## 2024-08-02 DIAGNOSIS — Z6791 Unspecified blood type, Rh negative: Secondary | ICD-10-CM

## 2024-08-02 DIAGNOSIS — Z3A14 14 weeks gestation of pregnancy: Secondary | ICD-10-CM

## 2024-08-02 DIAGNOSIS — Z3A15 15 weeks gestation of pregnancy: Secondary | ICD-10-CM

## 2024-08-02 DIAGNOSIS — O26892 Other specified pregnancy related conditions, second trimester: Secondary | ICD-10-CM | POA: Diagnosis not present

## 2024-08-02 DIAGNOSIS — Z3492 Encounter for supervision of normal pregnancy, unspecified, second trimester: Secondary | ICD-10-CM

## 2024-08-02 DIAGNOSIS — O219 Vomiting of pregnancy, unspecified: Secondary | ICD-10-CM

## 2024-08-02 DIAGNOSIS — O26899 Other specified pregnancy related conditions, unspecified trimester: Secondary | ICD-10-CM

## 2024-08-02 MED ORDER — ONDANSETRON 4 MG PO TBDP: 4.0000 mg | ORAL_TABLET | Freq: Three times a day (TID) | ORAL | 2 refills | Status: AC | PRN

## 2024-08-02 NOTE — Progress Notes (Signed)
 "  PRENATAL VISIT NOTE  Subjective:  Amy Bond is a 24 y.o. G3P1011 at [redacted]w[redacted]d being seen today for ongoing prenatal care.  She is currently monitored for the following issues for this low-risk pregnancy and has Supervision of other normal pregnancy, antepartum; History of intrauterine synechiae; and Rh negative state in antepartum period on their problem list.  Patient reports no complaints.  Contractions: Not present. Vag. Bleeding: None.   . Denies leaking of fluid.   The following portions of the patient's history were reviewed and updated as appropriate: allergies, current medications, past family history, past medical history, past social history, past surgical history and problem list.   Objective:   Vitals:   08/02/24 1416  BP: 114/77  Pulse: (!) 123  Weight: 131 lb (59.4 kg)    Fetal Status:           General: Alert, oriented and cooperative. Patient is in no acute distress.  Skin: Skin is warm and dry. No rash noted.   Cardiovascular: Normal heart rate noted  Respiratory: Normal respiratory effort, no problems with respiration noted  Abdomen: Soft, gravid, appropriate for gestational age.  Pain/Pressure: Absent     Pelvic: Cervical exam deferred        Extremities: Normal range of motion.     Mental Status: Normal mood and affect. Normal behavior. Normal judgment and thought content.      07/05/2024    3:10 PM 05/24/2023    9:51 AM 05/18/2023    9:30 AM  Depression screen PHQ 2/9  Decreased Interest 1 0 0  Down, Depressed, Hopeless 0 0 0  PHQ - 2 Score 1 0 0  Altered sleeping 1  0  Tired, decreased energy 1  2  Change in appetite 2  0  Feeling bad or failure about yourself  0  0  Trouble concentrating 0  0  Moving slowly or fidgety/restless 0  0  Suicidal thoughts 0  0  PHQ-9 Score 5  2   Difficult doing work/chores   Not difficult at all     Data saved with a previous flowsheet row definition        07/05/2024    3:10 PM 05/18/2023    9:30 AM  01/26/2023   10:45 AM  GAD 7 : Generalized Anxiety Score  Nervous, Anxious, on Edge 0  0  1   Control/stop worrying 0  0  0   Worry too much - different things 0  0  0   Trouble relaxing 0  1  0   Restless 0  0  0   Easily annoyed or irritable 0  0  2   Afraid - awful might happen 0  0  0   Total GAD 7 Score 0 1 3  Anxiety Difficulty  Not difficult at all      Data saved with a previous flowsheet row definition    Assessment and Plan:  Pregnancy: G3P1011 at [redacted]w[redacted]d 1. Encounter for supervision of low-risk pregnancy in second trimester (Primary) - Patient doing well.  - Reports occasional flutters. Reviewed fetal movement expectations based on trimester.   2. [redacted] weeks gestation of pregnancy - Reviewed 2nd trimester   3. Rh negative state in antepartum period - Rhogam at 28 weeks   4. Nausea and vomiting of pregnancy, antepartum - Rx for Zofran  sent to outpatient pharmacy.   Preterm labor symptoms and general obstetric precautions including but not limited to vaginal bleeding, contractions, leaking of  fluid and fetal movement were reviewed in detail with the patient. Please refer to After Visit Summary for other counseling recommendations.   Return in about 4 weeks (around 08/30/2024) for LOB.  Future Appointments  Date Time Provider Department Center  08/30/2024  2:30 PM Emilio Delilah CHRISTELLA EDDY [facility]  08/31/2024  8:00 AM [facility] PROVIDER 1 [facility]  08/31/2024  8:30 AM [facility] US1 [facility]    Holdan Stucke Erven) Emilio, MSN, CNM  Center for [facility]  08/02/2024 3:41 PM     "

## 2024-08-30 ENCOUNTER — Encounter: Admitting: Certified Nurse Midwife

## 2024-08-31 ENCOUNTER — Ambulatory Visit: Payer: Self-pay

## 2024-08-31 ENCOUNTER — Other Ambulatory Visit
